# Patient Record
Sex: Female | Born: 1970
Health system: Southern US, Community
[De-identification: ages and names within clinical notes are randomized; demographics above are authoritative.]

## PROBLEM LIST (undated history)

## (undated) DIAGNOSIS — I10 Essential (primary) hypertension: Secondary | ICD-10-CM

## (undated) DIAGNOSIS — D649 Anemia, unspecified: Secondary | ICD-10-CM

## (undated) DIAGNOSIS — Z862 Personal history of diseases of the blood and blood-forming organs and certain disorders involving the immune mechanism: Secondary | ICD-10-CM

## (undated) HISTORY — DX: Essential (primary) hypertension: I10

## (undated) HISTORY — PX: HERNIA REPAIR: SHX51

## (undated) HISTORY — DX: Anemia, unspecified: D64.9

## (undated) HISTORY — DX: Personal history of diseases of the blood and blood-forming organs and certain disorders involving the immune mechanism: Z86.2

---

## 2005-09-18 ENCOUNTER — Inpatient Hospital Stay (HOSPITAL_COMMUNITY): Admission: AD | Admit: 2005-09-18 | Discharge: 2005-09-20 | Payer: Self-pay | Admitting: Obstetrics and Gynecology

## 2005-10-09 ENCOUNTER — Encounter: Admission: RE | Admit: 2005-10-09 | Discharge: 2005-10-09 | Payer: Self-pay | Admitting: Sports Medicine

## 2007-03-27 ENCOUNTER — Inpatient Hospital Stay (HOSPITAL_COMMUNITY): Admission: AD | Admit: 2007-03-27 | Discharge: 2007-03-29 | Payer: Self-pay | Admitting: Obstetrics and Gynecology

## 2009-12-26 ENCOUNTER — Encounter: Admission: RE | Admit: 2009-12-26 | Discharge: 2009-12-26 | Payer: Self-pay | Admitting: Orthopedic Surgery

## 2010-11-30 HISTORY — PX: HERNIA REPAIR: SHX51

## 2011-04-17 NOTE — H&P (Signed)
NAMESEPTEMBER, MORMILE NO.:  [id number]   MEDICAL RECORD NO.:  [id number]          PATIENT TYPE:  INP   LOCATION:  9164                          FACILITY:  [facility]   PHYSICIAN:  Lenoard Aden, M.D.DATE OF BIRTH:  [birth year]   DATE OF ADMISSION:  03/27/2007  DATE OF DISCHARGE:                              HISTORY & PHYSICAL   INDICATION FOR INDUCTION:  A 39+ weeks granmultip with favorable cervix.  She is a 40 year old white female G6, P5, EDD of March 27, 2008, at 39+  weeks, favorable cervix for induction.  She has a history of no known  drug allergies.   MEDICATIONS:  Prenatal vitamins.   Tonsillectomy at age 89.  She is a nonsmoker, nondrinker.  She denies  domestic or physical violence.   MEDICATIONS:  Zofran and prenatal vitamins.   She has a history of 5 spontaneous vaginal deliveries in 1999, 2001,  2002, 2003 and 2006.  She is a nonsmoker, nondrinker.  She denies  domestic and physical violence.  Pregnancy course to date uncomplicated.  GBS is negative.   PHYSICAL EXAM:  She is a well-developed, well-nourished white female in  no acute distress.  HEENT:  Normal.  LUNGS:  Clear.  HEART:  Regular rhythm.  ABDOMEN:  Soft, gravid, nontender.  Estimated fetal weight 7 pounds.  CERVIX:  Is 3-4 cm, 70%, vertex -1.  EXTREMITIES:  Showed no __________ .  NEUROLOGIC EXAM:  Nonfocal.   IMPRESSION:  Term intrauterine pregnancy at 39+ weeks with a history of  precipitous labor, for induction.   PLAN:  Proceed with induction.  Risks and benefits discussed.      Lenoard Aden, M.D.  Electronically Signed     RJT/MEDQ  D:  03/27/2007  T:  03/27/2007  Job:  [id number]

## 2011-04-17 NOTE — H&P (Signed)
NAMESHTERNA, LARAMEE NO.:  [id number]   MEDICAL RECORD NO.:  [id number]          PATIENT TYPE:  INP   LOCATION:  9160                          [facility]ILITY:  [facility]   PHYSICIAN:  Lenoard Aden, M.D.DATE OF BIRTH:  Apr 14, 1971   DATE OF ADMISSION:  09/18/2005  DATE OF DISCHARGE:                                HISTORY & PHYSICAL   CHIEF COMPLAINT:  History of precipitous labor.   She is a 40 year old white female, G5, P4, [facility] September 28, 2005, at 38+  weeks, for induction due to history of precipitous labor.  She has a history  of four uncomplicated vaginal deliveries.   History of tonsillectomy.   FAMILY HISTORY:  Alcoholism and myocardial infarction.   She is a nonsmoker, nondrinker.  She denies domestic or physical violence.   PRENATAL LABORATORY DATA:  Blood type A positive.  Rubella immune.  Hepatitis and HIV nonreactive.  GBS is negative.   Pregnancy course uncomplicated.   PHYSICAL EXAMINATION:  GENERAL:  A well-developed, well-nourished white  female in no acute distress.  HEENT:  Normal.  CHEST:  Lungs clear.  CARDIAC:  Regular rate and rhythm.  ABDOMEN:  Soft, gravid, nontender.  Estimated fetal weight 7 pounds.  PELVIC:  Cervix is 2-3 cm, 80%, vertex -1.  EXTREMITIES:  No cords.  NEUROLOGIC:  Nonfocal.   IMPRESSION:  Term intrauterine pregnancy, history of precipitous labor, for  induction.   PLAN:  Proceed with induction.  Risks and benefits discussed.  Proceed with  Pitocin, epidural as needed.      Lenoard Aden, M.D.  Electronically Signed     RJT/MEDQ  D:  09/18/2005  T:  09/18/2005  Job:  [id number]

## 2011-12-17 ENCOUNTER — Encounter: Payer: Self-pay | Admitting: Cardiology

## 2011-12-17 ENCOUNTER — Ambulatory Visit (INDEPENDENT_AMBULATORY_CARE_PROVIDER_SITE_OTHER): Payer: BC Managed Care – PPO | Admitting: Cardiology

## 2011-12-17 VITALS — BP 152/94 | HR 77 | Ht 67.0 in | Wt 131.0 lb

## 2011-12-17 DIAGNOSIS — Z8249 Family history of ischemic heart disease and other diseases of the circulatory system: Secondary | ICD-10-CM

## 2011-12-17 DIAGNOSIS — I10 Essential (primary) hypertension: Secondary | ICD-10-CM | POA: Insufficient documentation

## 2011-12-17 DIAGNOSIS — R002 Palpitations: Secondary | ICD-10-CM

## 2011-12-17 NOTE — Progress Notes (Signed)
HPI Mrs. Tiffany Willis is a delightful 41 year old married white female, daughter of a patient of mine, who comes today self referred for palpitations and family history of coronary disease.  She had the flu 2 weeks before Christmas. He still has a dry cough. She has intermittent palpitations that are not related to activity. She denies any chest pain, presyncope or syncope. She denies any fever, headache, or chills.   She denies orthopnea, PND or edema.  She also has episodes of tingling throughout her body. These are intermittent, diffuse, and sporadic.  She denies any symptoms of polyuria, polydipsia, heat or cold intolerance.There is no history of hyperthyroidism or thyroid disease.  Her father has coronary disease and hyperlipidemia. He has not had her lipids checked in years. She has no other risk factors.  History reviewed. No pertinent past medical history.  No current outpatient prescriptions on file.    No Known Allergies  Family History  Problem Relation Age of Onset  . Heart disease      History   Social History  . Marital Status: Married    Spouse Name: N/A    Number of Children: 6  . Years of Education: N/A   Occupational History  . Not on file.   Social History Main Topics  . Smoking status: Former Games developer  . Smokeless tobacco: Not on file   Comment: socially when younger  . Alcohol Use: Yes     rarely  . Drug Use: No  . Sexually Active: Not on file   Other Topics Concern  . Not on file   Social History Narrative  . No narrative on file    ROS ALL NEGATIVE EXCEPT THOSE NOTED IN HPI  PE  General Appearance: well developed, well nourished in no acute distress, HEENT: symmetrical face, PERRLA, good dentition  Neck: no JVD, thyromegaly, or adenopathy, trachea midline Chest: symmetric without deformity Cardiac: PMI non-displaced, RRR, normal S1, S2, no gallop or murmur Lung: clear to ausculation and percussion Vascular: all pulses full without bruits    Abdominal: nondistended, nontender, good bowel sounds, no HSM, no bruits Extremities: no cyanosis, clubbing or edema, no sign of DVT, no varicosities  Skin: normal color, no rashes Neuro: alert and oriented x 3, non-focal Pysch: normal affect  EKG Normal sinus rhythm, possible left atrial enlargement but unlikely. No sign of LVH.BMET No results found for this basename: na, k, cl, co2, glucose, bun, creatinine, calcium, gfrnonaa, gfraa    Lipid Panel  No results found for this basename: chol, trig, hdl, cholhdl, vldl, ldlcalc    CBC No results found for this basename: wbc, rbc, hgb, hct, plt, mcv, mch, mchc, rdw, neutrabs, lymphsabs, monoabs, eosabs, basosabs     He

## 2011-12-17 NOTE — Assessment & Plan Note (Signed)
I suspect these are benign and may be related to stress not to mention recovering from the flu. I'll make sure that she has no post viral cardiomyopathy so we'll obtain a echocardiogram. Also want to see if she has any degree of left ventricular hypertrophy with newly diagnosed hypertension.

## 2011-12-17 NOTE — Assessment & Plan Note (Signed)
This is potentially a new diagnosis. Blood pressure goals given and her husband will check pressures on regular basis. If remain elevated, and depending on her echocardiogram findings, will make recommendation.

## 2011-12-17 NOTE — Patient Instructions (Signed)
Your physician has requested that you have an echocardiogram. Echocardiography is a painless test that uses sound waves to create images of your heart. It provides your doctor with information about the size and shape of your heart and how well your heart's chambers and valves are working. This procedure takes approximately one hour. There are no restrictions for this procedure.  Your physician recommends that you return for lab work in:  Same day bas echo for fasting lab work  Your physician has requested that you regularly monitor and record your blood pressure readings at home. Please use the same machine at the same time of day to check your readings and record them to bring to your follow-up visit.  Goal blood pressure of 130/80 or less

## 2011-12-17 NOTE — Assessment & Plan Note (Signed)
We'll check fasting lipids, baseline chemistries, liver function and TSH.

## 2011-12-18 ENCOUNTER — Other Ambulatory Visit: Payer: BC Managed Care – PPO | Admitting: *Deleted

## 2011-12-18 ENCOUNTER — Other Ambulatory Visit (HOSPITAL_COMMUNITY): Payer: BC Managed Care – PPO | Admitting: Radiology

## 2011-12-24 ENCOUNTER — Other Ambulatory Visit (INDEPENDENT_AMBULATORY_CARE_PROVIDER_SITE_OTHER): Payer: BC Managed Care – PPO | Admitting: *Deleted

## 2011-12-24 ENCOUNTER — Ambulatory Visit (HOSPITAL_COMMUNITY): Payer: BC Managed Care – PPO | Attending: Cardiology | Admitting: Radiology

## 2011-12-24 DIAGNOSIS — R002 Palpitations: Secondary | ICD-10-CM

## 2011-12-24 DIAGNOSIS — I10 Essential (primary) hypertension: Secondary | ICD-10-CM | POA: Insufficient documentation

## 2011-12-24 DIAGNOSIS — Z8249 Family history of ischemic heart disease and other diseases of the circulatory system: Secondary | ICD-10-CM | POA: Insufficient documentation

## 2011-12-24 LAB — BASIC METABOLIC PANEL
BUN: 11 mg/dL (ref 6–23)
CO2: 27 mEq/L (ref 19–32)
Calcium: 9.1 mg/dL (ref 8.4–10.5)
Chloride: 107 mEq/L (ref 96–112)
Creatinine, Ser: 0.7 mg/dL (ref 0.4–1.2)
GFR: 98.74 mL/min (ref >60.00)
Glucose, Bld: 81 mg/dL (ref 70–99)
Potassium: 3.9 mEq/L (ref 3.5–5.1)
Sodium: 140 mEq/L (ref 135–145)

## 2011-12-24 LAB — HEPATIC FUNCTION PANEL
Albumin: 4.1 g/dL (ref 3.5–5.2)
Alkaline Phosphatase: 41 U/L (ref 39–117)
Bilirubin, Direct: 0.1 mg/dL (ref 0.0–0.3)
Total Protein: 6.9 g/dL (ref 6.0–8.3)

## 2011-12-24 LAB — LIPID PANEL
Cholesterol: 173 mg/dL (ref 0–200)
HDL: 58.2 mg/dL (ref >39.00)
LDL Cholesterol: 106 mg/dL — ABNORMAL HIGH (ref 0–99)
Triglycerides: 45 mg/dL (ref 0.0–149.0)
VLDL: 9 mg/dL (ref 0.0–40.0)

## 2014-07-24 ENCOUNTER — Telehealth: Payer: Self-pay | Admitting: Obstetrics & Gynecology

## 2014-07-24 NOTE — Telephone Encounter (Signed)
lmtcb re: new patient doctor referral with Dr. Hyacinth Meeker 07/30/14.

## 2014-07-30 ENCOUNTER — Encounter: Payer: Self-pay | Admitting: Obstetrics & Gynecology

## 2014-07-30 ENCOUNTER — Ambulatory Visit (INDEPENDENT_AMBULATORY_CARE_PROVIDER_SITE_OTHER): Payer: BC Managed Care – PPO | Admitting: Obstetrics & Gynecology

## 2014-07-30 VITALS — BP 138/90 | HR 68 | Ht 66.5 in | Wt 130.0 lb

## 2014-07-30 DIAGNOSIS — N393 Stress incontinence (female) (male): Secondary | ICD-10-CM

## 2014-07-30 DIAGNOSIS — Z01419 Encounter for gynecological examination (general) (routine) without abnormal findings: Secondary | ICD-10-CM

## 2014-07-30 DIAGNOSIS — N920 Excessive and frequent menstruation with regular cycle: Secondary | ICD-10-CM

## 2014-07-30 DIAGNOSIS — D509 Iron deficiency anemia, unspecified: Secondary | ICD-10-CM

## 2014-07-30 DIAGNOSIS — Z23 Encounter for immunization: Secondary | ICD-10-CM

## 2014-07-30 DIAGNOSIS — Z124 Encounter for screening for malignant neoplasm of cervix: Secondary | ICD-10-CM

## 2014-07-30 DIAGNOSIS — Z0000 Encounter for general adult medical examination without abnormal findings: Secondary | ICD-10-CM

## 2014-07-30 LAB — POCT URINALYSIS DIPSTICK
Bilirubin, UA: NEGATIVE
Blood, UA: NEGATIVE
Glucose, UA: NEGATIVE
Ketones, UA: NEGATIVE
Leukocytes, UA: NEGATIVE
Nitrite, UA: NEGATIVE
Protein, UA: NEGATIVE
Urobilinogen, UA: NEGATIVE
pH, UA: 7

## 2014-07-30 NOTE — Progress Notes (Signed)
43 y.o. G6P6 MWF here for AEX.  Has not has a gynecology exam in several years.  PCP:  Martha Clan.  Screening labs were done about a year ago.  Anemia was noted then.  Repeat about 10 days ago showing continued anemia.  Anemia most likely due to menorrhagia.  Cycles are regular lasting 5-6 days with one heavy day where she changes tampons about every hour.  On iron.  Hasn't really considered other options for bleeding.  Is interested in discussing these.  Does feel tired a lot.  Has six children.  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  Having as much energy as possible is needed for her life.  Also, having some urinary incontinence.  Is primarily with coughing and sneezing or laughing.  Would consider treatment options.  Patient's last menstrual period was 07/23/2014.          Sexually active: Yes.    The current method of family planning is vasectomy.    Exercising: No.  The patient does not participate in regular exercise at present. Smoker:  no  Health Maintenance: Pap:  About 5 years History of abnormal Pap:  no MMG:  2012 normal per pt Colonoscopy:  never BMD:   never TDaP:  unsure Screening Labs: last week (pcp), Hb today: PCP, Urine today: negative   reports that she has quit smoking. She does not have any smokeless tobacco history on file. She reports that she drinks alcohol. She reports that she does not use illicit drugs.  History reviewed. No pertinent past medical history.  Past Surgical History  Procedure Laterality Date  . Hernia repair      Current Outpatient Prescriptions  Medication Sig Dispense Refill  . loratadine (CLARITIN) 10 MG tablet Take 10 mg by mouth daily.      . Prenatal Vit-Fe Psac Cmplx-FA (POLY IRON PN PO) Take by mouth daily.       No current facility-administered medications for this visit.    Family History  Problem Relation Age of Onset  . Heart disease      ROS:  Pertinent items are noted in HPI.  Otherwise, a comprehensive ROS was  negative.  Exam:   BP 138/90  Pulse 68  Ht 5' 6.5" (1.689 m)  Wt 130 lb (58.968 kg)  BMI 20.67 kg/m2  LMP 07/23/2014  Height: 5' 6.5" (168.9 cm)  Ht Readings from Last 3 Encounters:  07/30/14 5' 6.5" (1.689 m)  12/17/11  (1.702 m)    General appearance: alert, cooperative and appears stated age Head: Normocephalic, without obvious abnormality, atraumatic Neck: no adenopathy, supple, symmetrical, trachea midline and thyroid normal to inspection and palpation Lungs: clear to auscultation bilaterally Breasts: normal appearance, no masses or tenderness Heart: regular rate and rhythm Abdomen: soft, non-tender; bowel sounds normal; no masses,  no organomegaly Extremities: extremities normal, atraumatic, no cyanosis or edema Skin: Skin color, texture, turgor normal. No rashes or lesions Lymph nodes: Cervical, supraclavicular, and axillary nodes normal. No abnormal inguinal nodes palpated Neurologic: Grossly normal   Pelvic: External genitalia:  no lesions              Urethra:  normal appearing urethra with no masses, tenderness or lesions              Bartholins and Skenes: normal                 Vagina: normal appearing vagina with normal color and discharge, no lesions  Cervix: no lesions              Pap taken: Yes.   Bimanual Exam:  Uterus:  normal size, contour, position, consistency, mobility, non-tender              Adnexa: normal adnexa and no mass, fullness, tenderness               Rectovaginal: Confirms               Anus:  normal sphincter tone, no lesions  Due to bleeding, recommended proceeding with endometrial biopsy today.  Discussed with patient.  Verbal and written consent obtained.   Procedure:  Speculum placed.  Cervix visualized and cleansed with betadine prep.  A single toothed tenaculum was not applied to the anterior lip of the cervix.  Endometrial pipelle was advanced through the cervix into the endometrial cavity without difficulty.   Pipelle passed to 8cm.  Suction applied and pipelle removed with good tissue sample obtained.  Tenculum removed.  No bleeding noted.  Patient tolerated procedure well.  Speculum removed.  A:  Well Woman with normal exam Menorrhagia Anemia H/o NSVD x 6 with no significant prolpase  P:   Mammogram recommended.  Pt states she will schedule.  Information provided. pap smear with HR HPV done today Endometrial biopsy results pending Tdap given today. Will get records from Dr. Alver Fisher office regarding last hemoglobin so I have comparisons for future. Information regarinding IUD use and endometrial ablation given.  Also discussed OCPs and other progesterone methods.  Pt most interested in Mirena IUD or endometrial ablation. Consult with Dr. Edward Jolly regarding incontinence scheduled.  Urodynamics discussed as well as treatment with surgery or PT.   Return annually or prn  An After Visit Summary was printed and given to the patient.

## 2014-08-01 ENCOUNTER — Telehealth: Payer: Self-pay | Admitting: *Deleted

## 2014-08-01 DIAGNOSIS — N920 Excessive and frequent menstruation with regular cycle: Secondary | ICD-10-CM | POA: Insufficient documentation

## 2014-08-01 DIAGNOSIS — D509 Iron deficiency anemia, unspecified: Secondary | ICD-10-CM | POA: Insufficient documentation

## 2014-08-01 DIAGNOSIS — N393 Stress incontinence (female) (male): Secondary | ICD-10-CM | POA: Insufficient documentation

## 2014-08-01 LAB — IPS PAP TEST WITH HPV

## 2014-08-01 NOTE — Telephone Encounter (Signed)
Message copied by Alisa Graff on Wed Aug 01, 2014  3:52 PM ------      Message from: Jerene Bears      Created: Wed Aug 01, 2014  3:28 PM      Regarding: pathology       I signed off her pathology without a note when I signed off her pap.  Can you call her and let her know her endometrial biopsy was negative, her Pap was negative, and her HR HPV was negative.  She is deciding between and IUD and an ablation.  If she wants the IUD, she can call with onset of cycle to schedule.  If wants ablation, needs [facility] scheduled.  She is also seeing Dr. Edward Jolly about incontinence.  Thanks.            MSM ------

## 2014-08-01 NOTE — Telephone Encounter (Signed)
Call to patient, notified of pap, HPV and endometrial biopsy results all negative. Patient states she and husband have started talking about options but she is not quite ready to make a final decision. Advised with negative biopsy, she is not really pushed to make decision, can decide on her own time line and call us if/when ready to proceed. She is scheduled for consult with Dr Edward Jolly.  Encounter closed.

## 2014-08-10 ENCOUNTER — Ambulatory Visit: Payer: Self-pay | Admitting: Obstetrics and Gynecology

## 2014-08-15 ENCOUNTER — Telehealth: Payer: Self-pay | Admitting: Obstetrics & Gynecology

## 2014-08-15 NOTE — Telephone Encounter (Signed)
Pt cancelled consult appt with Dr Hyacinth Meeker and will call back to reschedule.

## 2014-08-15 NOTE — Telephone Encounter (Signed)
This is consult with Dr. Edward Jolly to discuss urinary concerns.  Routing to Dr. Hyacinth Meeker as Lorain Childes,  Will close encounter.

## 2014-08-17 ENCOUNTER — Institutional Professional Consult (permissible substitution): Payer: BC Managed Care – PPO | Admitting: Obstetrics and Gynecology

## 2014-10-01 ENCOUNTER — Encounter: Payer: Self-pay | Admitting: Obstetrics & Gynecology

## 2014-11-30 HISTORY — PX: LUMBAR DISC SURGERY: SHX700

## 2015-01-02 ENCOUNTER — Telehealth: Payer: Self-pay | Admitting: Obstetrics & Gynecology

## 2015-01-02 NOTE — Telephone Encounter (Signed)
Left message regarding upcoming appointment has been canceled and needs to be rescheduled. °

## 2015-05-15 ENCOUNTER — Other Ambulatory Visit: Payer: Self-pay | Admitting: Orthopedic Surgery

## 2015-05-15 DIAGNOSIS — M5410 Radiculopathy, site unspecified: Secondary | ICD-10-CM

## 2015-05-16 ENCOUNTER — Ambulatory Visit
Admission: RE | Admit: 2015-05-16 | Discharge: 2015-05-16 | Disposition: A | Discharge status: Home / Self Care | Payer: BLUE CROSS/BLUE SHIELD | Source: Ambulatory Visit | Attending: Orthopedic Surgery | Admitting: Orthopedic Surgery

## 2015-05-16 DIAGNOSIS — M5410 Radiculopathy, site unspecified: Secondary | ICD-10-CM

## 2015-08-29 ENCOUNTER — Ambulatory Visit: Payer: BC Managed Care – PPO | Admitting: Obstetrics & Gynecology

## 2016-07-09 DIAGNOSIS — R0982 Postnasal drip: Secondary | ICD-10-CM | POA: Diagnosis not present

## 2016-07-09 DIAGNOSIS — F458 Other somatoform disorders: Secondary | ICD-10-CM | POA: Diagnosis not present

## 2016-08-05 DIAGNOSIS — D225 Melanocytic nevi of trunk: Secondary | ICD-10-CM | POA: Diagnosis not present

## 2016-08-05 DIAGNOSIS — D2272 Melanocytic nevi of left lower limb, including hip: Secondary | ICD-10-CM | POA: Diagnosis not present

## 2016-08-05 DIAGNOSIS — Z86018 Personal history of other benign neoplasm: Secondary | ICD-10-CM | POA: Diagnosis not present

## 2016-09-04 DIAGNOSIS — R0982 Postnasal drip: Secondary | ICD-10-CM | POA: Diagnosis not present

## 2016-09-04 DIAGNOSIS — R05 Cough: Secondary | ICD-10-CM | POA: Diagnosis not present

## 2016-09-04 DIAGNOSIS — Z6822 Body mass index (BMI) 22.0-22.9, adult: Secondary | ICD-10-CM | POA: Diagnosis not present

## 2016-09-30 DIAGNOSIS — N3946 Mixed incontinence: Secondary | ICD-10-CM | POA: Diagnosis not present

## 2016-09-30 DIAGNOSIS — R351 Nocturia: Secondary | ICD-10-CM | POA: Diagnosis not present

## 2016-09-30 DIAGNOSIS — R350 Frequency of micturition: Secondary | ICD-10-CM | POA: Diagnosis not present

## 2016-09-30 DIAGNOSIS — N816 Rectocele: Secondary | ICD-10-CM | POA: Diagnosis not present

## 2016-12-02 DIAGNOSIS — F4321 Adjustment disorder with depressed mood: Secondary | ICD-10-CM | POA: Diagnosis not present

## 2016-12-07 DIAGNOSIS — F4321 Adjustment disorder with depressed mood: Secondary | ICD-10-CM | POA: Diagnosis not present

## 2016-12-08 DIAGNOSIS — F4321 Adjustment disorder with depressed mood: Secondary | ICD-10-CM | POA: Diagnosis not present

## 2016-12-24 DIAGNOSIS — H5213 Myopia, bilateral: Secondary | ICD-10-CM | POA: Diagnosis not present

## 2017-01-19 DIAGNOSIS — F4321 Adjustment disorder with depressed mood: Secondary | ICD-10-CM | POA: Diagnosis not present

## 2017-01-26 DIAGNOSIS — F4321 Adjustment disorder with depressed mood: Secondary | ICD-10-CM | POA: Diagnosis not present

## 2017-02-11 DIAGNOSIS — F4321 Adjustment disorder with depressed mood: Secondary | ICD-10-CM | POA: Diagnosis not present

## 2017-02-18 DIAGNOSIS — F4321 Adjustment disorder with depressed mood: Secondary | ICD-10-CM | POA: Diagnosis not present

## 2017-04-08 DIAGNOSIS — F4321 Adjustment disorder with depressed mood: Secondary | ICD-10-CM | POA: Diagnosis not present

## 2017-04-29 DIAGNOSIS — F4321 Adjustment disorder with depressed mood: Secondary | ICD-10-CM | POA: Diagnosis not present

## 2017-06-10 ENCOUNTER — Other Ambulatory Visit: Payer: Self-pay | Admitting: Neurological Surgery

## 2017-06-10 DIAGNOSIS — M5127 Other intervertebral disc displacement, lumbosacral region: Secondary | ICD-10-CM

## 2017-06-11 ENCOUNTER — Ambulatory Visit (INDEPENDENT_AMBULATORY_CARE_PROVIDER_SITE_OTHER): Payer: BLUE CROSS/BLUE SHIELD | Admitting: Internal Medicine

## 2017-06-11 DIAGNOSIS — Z23 Encounter for immunization: Secondary | ICD-10-CM | POA: Diagnosis not present

## 2017-06-11 DIAGNOSIS — Z9189 Other specified personal risk factors, not elsewhere classified: Secondary | ICD-10-CM | POA: Diagnosis not present

## 2017-06-11 DIAGNOSIS — Z7184 Encounter for health counseling related to travel: Secondary | ICD-10-CM | POA: Insufficient documentation

## 2017-06-11 DIAGNOSIS — Z789 Other specified health status: Secondary | ICD-10-CM | POA: Diagnosis not present

## 2017-06-11 DIAGNOSIS — Z7189 Other specified counseling: Secondary | ICD-10-CM | POA: Diagnosis not present

## 2017-06-11 MED ORDER — TYPHOID VACCINE PO CPDR: 1.0000 | DELAYED_RELEASE_CAPSULE | ORAL | 0 refills | Status: DC

## 2017-06-11 MED ORDER — CIPROFLOXACIN HCL 500 MG PO TABS: 500.0000 mg | ORAL_TABLET | Freq: Two times a day (BID) | ORAL | 0 refills | Status: DC

## 2017-06-11 MED ORDER — CHLOROQUINE PHOSPHATE 500 MG PO TABS: 500.0000 mg | ORAL_TABLET | ORAL | 0 refills | Status: DC

## 2017-06-11 NOTE — Progress Notes (Signed)
Subjective:   Tiffany Willis is a 46 y.o. female who presents to the Infectious Disease clinic for travel consultation. 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 none     Plan:    Issues discussed: future shots, malaria, MVA safety, rabies, safe food/water, traveler's diarrhea, website/handouts for more information, what to do if ill upon return and what to do if ill while there. Immunizations recommended: Hepatitis A series and Typhoid (oral). Malaria prophylaxis: chloroquine, weekly dose starting 1 week before entering endemic area, ending 4 weeks after leaving area Traveler's diarrhea prophylaxis: ciprofloxacin. Total duration of visit: 1 Hour. Total time spent on education, counseling, coordination of care: 30 Minutes.

## 2017-06-11 NOTE — Patient Instructions (Signed)
[facility]                [address]                   [place], [place] [id number]                      Phone: [phone]                        Fax: [phone]   Planned departure date: July 2018          Planned return date: 7 days Countries of travel: [place]   Guidelines for the Prevention & Treatment of Traveler's Diarrhea  Prevention: "Boil it, Peel it, Coleraineook it, or Forget it"   the fewer chances -> lower risk: try to stick to food & water precautions as much as possible"   If it's "piping hot"; it is probably okay, if not, it may not be   Treatment   1) You should always take care to drink lots of fluids in order to avoid dehydration   2) You should bring medications with you in case you come down with a case of diarrhea   3) OTC = bring pepto-bismol - can take with initial abdominal symptoms;                    Imodium - can help slow down your intestinal tract, can help relief cramps                    and diarrhea, can take if no bloody diarrhea  Use ciprofloxacin if needed for traveler's diarrhea  Guidelines for the Prevention of Malaria  Avoidance:  -fewer mosquito bites = lower risk. Mosquitos can bite at night as well as daytime  -cover up (long sleeve clothing), mosquito nets, screens  -Insect repellent for your skin ( DEET containing lotion > 20%): for clothes ( permethrin spray)   2 weeks prior to travel, start chloroquine, weekly dose starting 1 week before entering endemic area, ending 4 weeks after leaving area for malaria prevention.   Immunizations received today: Hepatitis A series and Typhoid (parenteral)  Future immunizations, if indicated Hepatitis A series in 6-12 months, after January 2019   Prior to travel:  1) Be sure to pick up appropriate prescriptions, including medicine you take daily. Do not expect to be able to fill your prescriptions abroad.  2) Strongly  consider obtaining traveler's insurance, including emergency evacuation insurance. Most plans in the [place] do not cover participants abroad. (see below for resources)  3) Register at the appropriate [place] embassy or consulate with travel dates so they are aware of your presence in-country and for helpful advice during travel using the [employer] Traveler Enrollment Program (STEP, GuyGalaxy.sihttps://step.state.gov/step).  4) Leave contact information with a relative or friend.  5) Keep a Corporate treasurerphotocopy passport, credit cards in case they become lost or stolen  6) Inform your credit card company that you will be travelling abroad   During travel:  1) If you become ill and need medical advice, the [place] WellPointembassy website of the country you are traveling in general provides a list of English speaking doctors.  We are also available on MyChart for remote consultation if you register prior to travel. 2) Avoid motorcycles or scooters when at all possible. Traffic laws in many  countries are lax and accidents occur frequently.  3) Do not take any unnecessary risks that you wouldn't do at home.   Resources:  -Country specific information: BlindResource.ca or GreenNylon.com.cy  -Press photographer (DEET, mosquito nets): [employer], [employer] store, [employer], [place] insurance options: gatewayplans.com; http://clayton-rivera.info/; travelguard.com or Good Pilgrim's Pride, gninsurance.com or info@gninsurance .com, 269-689-4941.   Post Travel:  If you return from your trip ill, call your primary care doctor or our travel clinic @ [phone].   Enjoy your trip and know that with proper pre-travel preparation, most people have an enjoyable and uninterrupted trip!

## 2017-06-18 ENCOUNTER — Inpatient Hospital Stay
Admission: RE | Admit: 2017-06-18 | Discharge: 2017-06-18 | Disposition: A | Discharge status: Home / Self Care | Payer: BLUE CROSS/BLUE SHIELD | Source: Ambulatory Visit | Attending: Neurological Surgery | Admitting: Neurological Surgery

## 2017-09-09 DIAGNOSIS — Z0000 Encounter for general adult medical examination without abnormal findings: Secondary | ICD-10-CM | POA: Diagnosis not present

## 2017-09-10 DIAGNOSIS — Z23 Encounter for immunization: Secondary | ICD-10-CM | POA: Diagnosis not present

## 2017-09-10 DIAGNOSIS — N946 Dysmenorrhea, unspecified: Secondary | ICD-10-CM | POA: Diagnosis not present

## 2017-09-10 DIAGNOSIS — E611 Iron deficiency: Secondary | ICD-10-CM | POA: Diagnosis not present

## 2017-09-10 DIAGNOSIS — Z0000 Encounter for general adult medical examination without abnormal findings: Secondary | ICD-10-CM | POA: Diagnosis not present

## 2017-09-10 DIAGNOSIS — R5383 Other fatigue: Secondary | ICD-10-CM | POA: Diagnosis not present

## 2017-09-10 DIAGNOSIS — Z1389 Encounter for screening for other disorder: Secondary | ICD-10-CM | POA: Diagnosis not present

## 2017-09-14 DIAGNOSIS — R5383 Other fatigue: Secondary | ICD-10-CM | POA: Diagnosis not present

## 2017-09-14 DIAGNOSIS — E611 Iron deficiency: Secondary | ICD-10-CM | POA: Diagnosis not present

## 2017-10-27 DIAGNOSIS — F4321 Adjustment disorder with depressed mood: Secondary | ICD-10-CM | POA: Diagnosis not present

## 2018-01-13 DIAGNOSIS — H5213 Myopia, bilateral: Secondary | ICD-10-CM | POA: Diagnosis not present

## 2018-08-17 DIAGNOSIS — D2272 Melanocytic nevi of left lower limb, including hip: Secondary | ICD-10-CM | POA: Diagnosis not present

## 2018-08-17 DIAGNOSIS — Z86018 Personal history of other benign neoplasm: Secondary | ICD-10-CM | POA: Diagnosis not present

## 2018-08-17 DIAGNOSIS — D225 Melanocytic nevi of trunk: Secondary | ICD-10-CM | POA: Diagnosis not present

## 2018-09-12 DIAGNOSIS — J029 Acute pharyngitis, unspecified: Secondary | ICD-10-CM | POA: Diagnosis not present

## 2018-09-12 DIAGNOSIS — R030 Elevated blood-pressure reading, without diagnosis of hypertension: Secondary | ICD-10-CM | POA: Diagnosis not present

## 2018-09-12 DIAGNOSIS — R599 Enlarged lymph nodes, unspecified: Secondary | ICD-10-CM | POA: Diagnosis not present

## 2018-09-12 DIAGNOSIS — J069 Acute upper respiratory infection, unspecified: Secondary | ICD-10-CM | POA: Diagnosis not present

## 2018-09-12 DIAGNOSIS — N6313 Unspecified lump in the right breast, lower outer quadrant: Secondary | ICD-10-CM | POA: Diagnosis not present

## 2018-09-13 ENCOUNTER — Other Ambulatory Visit: Payer: Self-pay | Admitting: Family Medicine

## 2018-09-13 DIAGNOSIS — N6313 Unspecified lump in the right breast, lower outer quadrant: Secondary | ICD-10-CM

## 2018-09-13 DIAGNOSIS — R5383 Other fatigue: Secondary | ICD-10-CM | POA: Diagnosis not present

## 2018-09-13 DIAGNOSIS — Z0000 Encounter for general adult medical examination without abnormal findings: Secondary | ICD-10-CM | POA: Diagnosis not present

## 2018-09-20 ENCOUNTER — Ambulatory Visit
Admission: RE | Admit: 2018-09-20 | Discharge: 2018-09-20 | Disposition: A | Discharge status: Home / Self Care | Payer: BLUE CROSS/BLUE SHIELD | Source: Ambulatory Visit | Attending: Family Medicine | Admitting: Family Medicine

## 2018-09-20 ENCOUNTER — Other Ambulatory Visit: Payer: Self-pay | Admitting: Family Medicine

## 2018-09-20 DIAGNOSIS — N6311 Unspecified lump in the right breast, upper outer quadrant: Secondary | ICD-10-CM | POA: Diagnosis not present

## 2018-09-20 DIAGNOSIS — N6313 Unspecified lump in the right breast, lower outer quadrant: Secondary | ICD-10-CM

## 2018-09-20 DIAGNOSIS — Z1389 Encounter for screening for other disorder: Secondary | ICD-10-CM | POA: Diagnosis not present

## 2018-09-20 DIAGNOSIS — R7989 Other specified abnormal findings of blood chemistry: Secondary | ICD-10-CM | POA: Diagnosis not present

## 2018-09-20 DIAGNOSIS — R030 Elevated blood-pressure reading, without diagnosis of hypertension: Secondary | ICD-10-CM | POA: Diagnosis not present

## 2018-09-20 DIAGNOSIS — Z23 Encounter for immunization: Secondary | ICD-10-CM | POA: Diagnosis not present

## 2018-09-20 DIAGNOSIS — Z0000 Encounter for general adult medical examination without abnormal findings: Secondary | ICD-10-CM | POA: Diagnosis not present

## 2018-09-20 DIAGNOSIS — R922 Inconclusive mammogram: Secondary | ICD-10-CM | POA: Diagnosis not present

## 2018-09-20 DIAGNOSIS — R5383 Other fatigue: Secondary | ICD-10-CM | POA: Diagnosis not present

## 2018-09-20 DIAGNOSIS — E611 Iron deficiency: Secondary | ICD-10-CM | POA: Diagnosis not present

## 2018-09-20 DIAGNOSIS — J069 Acute upper respiratory infection, unspecified: Secondary | ICD-10-CM | POA: Diagnosis not present

## 2018-10-13 ENCOUNTER — Encounter: Payer: Self-pay | Admitting: Obstetrics & Gynecology

## 2018-11-29 ENCOUNTER — Ambulatory Visit
Admission: RE | Admit: 2018-11-29 | Discharge: 2018-11-29 | Disposition: A | Discharge status: Home / Self Care | Payer: BLUE CROSS/BLUE SHIELD | Source: Ambulatory Visit | Attending: Family Medicine | Admitting: Family Medicine

## 2018-11-29 DIAGNOSIS — N6489 Other specified disorders of breast: Secondary | ICD-10-CM | POA: Diagnosis not present

## 2018-11-29 DIAGNOSIS — N6313 Unspecified lump in the right breast, lower outer quadrant: Secondary | ICD-10-CM

## 2019-01-26 DIAGNOSIS — H5213 Myopia, bilateral: Secondary | ICD-10-CM | POA: Diagnosis not present

## 2019-02-02 DIAGNOSIS — F4322 Adjustment disorder with anxiety: Secondary | ICD-10-CM | POA: Diagnosis not present

## 2019-05-18 DIAGNOSIS — F4322 Adjustment disorder with anxiety: Secondary | ICD-10-CM | POA: Diagnosis not present

## 2019-09-19 DIAGNOSIS — Z0000 Encounter for general adult medical examination without abnormal findings: Secondary | ICD-10-CM | POA: Diagnosis not present

## 2019-09-20 DIAGNOSIS — E611 Iron deficiency: Secondary | ICD-10-CM | POA: Diagnosis not present

## 2019-09-22 ENCOUNTER — Other Ambulatory Visit: Payer: Self-pay | Admitting: Internal Medicine

## 2019-09-22 DIAGNOSIS — Z1231 Encounter for screening mammogram for malignant neoplasm of breast: Secondary | ICD-10-CM

## 2019-09-25 ENCOUNTER — Other Ambulatory Visit: Payer: Self-pay

## 2019-09-25 DIAGNOSIS — Z20822 Contact with and (suspected) exposure to covid-19: Secondary | ICD-10-CM

## 2019-09-26 DIAGNOSIS — Z23 Encounter for immunization: Secondary | ICD-10-CM | POA: Diagnosis not present

## 2019-09-26 DIAGNOSIS — Z1331 Encounter for screening for depression: Secondary | ICD-10-CM | POA: Diagnosis not present

## 2019-09-26 DIAGNOSIS — N946 Dysmenorrhea, unspecified: Secondary | ICD-10-CM | POA: Diagnosis not present

## 2019-09-26 DIAGNOSIS — E611 Iron deficiency: Secondary | ICD-10-CM | POA: Diagnosis not present

## 2019-09-26 DIAGNOSIS — Z0000 Encounter for general adult medical examination without abnormal findings: Secondary | ICD-10-CM | POA: Diagnosis not present

## 2019-09-26 LAB — NOVEL CORONAVIRUS, NAA: SARS-CoV-2, NAA: NOT DETECTED

## 2019-10-04 DIAGNOSIS — F4322 Adjustment disorder with anxiety: Secondary | ICD-10-CM | POA: Diagnosis not present

## 2019-11-14 ENCOUNTER — Other Ambulatory Visit: Payer: Self-pay

## 2019-11-14 ENCOUNTER — Ambulatory Visit
Admission: RE | Admit: 2019-11-14 | Discharge: 2019-11-14 | Disposition: A | Discharge status: Home / Self Care | Payer: BC Managed Care – PPO | Source: Ambulatory Visit | Attending: Internal Medicine | Admitting: Internal Medicine

## 2019-11-14 DIAGNOSIS — Z1231 Encounter for screening mammogram for malignant neoplasm of breast: Secondary | ICD-10-CM | POA: Diagnosis not present

## 2020-01-02 ENCOUNTER — Other Ambulatory Visit: Payer: Self-pay

## 2020-01-04 ENCOUNTER — Other Ambulatory Visit (HOSPITAL_COMMUNITY)
Admission: RE | Admit: 2020-01-04 | Discharge: 2020-01-04 | Disposition: A | Discharge status: Home / Self Care | Payer: BC Managed Care – PPO | Source: Ambulatory Visit | Attending: Obstetrics & Gynecology | Admitting: Obstetrics & Gynecology

## 2020-01-04 ENCOUNTER — Ambulatory Visit (INDEPENDENT_AMBULATORY_CARE_PROVIDER_SITE_OTHER): Payer: BC Managed Care – PPO | Admitting: Obstetrics & Gynecology

## 2020-01-04 ENCOUNTER — Other Ambulatory Visit: Payer: Self-pay

## 2020-01-04 ENCOUNTER — Encounter: Payer: Self-pay | Admitting: Obstetrics & Gynecology

## 2020-01-04 ENCOUNTER — Encounter: Payer: BC Managed Care – PPO | Admitting: Obstetrics & Gynecology

## 2020-01-04 VITALS — BP 110/62 | HR 76 | Temp 97.3°F | Resp 10 | Ht 66.25 in | Wt 142.0 lb

## 2020-01-04 DIAGNOSIS — Z01419 Encounter for gynecological examination (general) (routine) without abnormal findings: Secondary | ICD-10-CM

## 2020-01-04 DIAGNOSIS — R87610 Atypical squamous cells of undetermined significance on cytologic smear of cervix (ASC-US): Secondary | ICD-10-CM | POA: Diagnosis not present

## 2020-01-04 DIAGNOSIS — Z124 Encounter for screening for malignant neoplasm of cervix: Secondary | ICD-10-CM | POA: Diagnosis not present

## 2020-01-04 MED ORDER — NORETHINDRONE 0.35 MG PO TABS: 1.0000 | ORAL_TABLET | Freq: Every day | ORAL | 0 refills | Status: DC

## 2020-01-04 NOTE — Progress Notes (Signed)
49 y.o. [id number] Married White or Caucasian female here for new patient exam.  Pt was last seen in 2015.  Cycles are regular.  Flow is heavy for two days and then tapers off and lasts about 6 days.    PCP:  Dr. Clelia Croft.  Last appt was in person.  Had blood work done.  Has some mild anemia.    Patient's last menstrual period was 12/18/2019 (within days).          Sexually active: Yes.    The current method of family planning is vasectomy.    Exercising: No.  Smoker:  no  Health Maintenance: Pap:  07/30/14 Neg:Neg HR HPV History of abnormal Pap:  no MMG:  11/14/19 BIRADS 1 negative/density c Colonoscopy:  n/a BMD:   n/a TDaP:  07/30/14 Pneumonia vaccine(s):  n/a Shingrix:   no Hep C testing: n/a Screening Labs: done in October   reports that she has quit smoking. She has never used smokeless tobacco. She reports current alcohol use of about 7.0 - 14.0 standard drinks of alcohol per week. She reports that she does not use drugs.  Past Medical History:  Diagnosis Date  . Anemia     Past Surgical History:  Procedure Laterality Date  . HERNIA REPAIR    . HERNIA REPAIR  2012  . LUMBAR DISC SURGERY      Current Outpatient Medications  Medication Sig Dispense Refill  . Prenatal Vit-Fe Psac Cmplx-FA (POLY IRON PN PO) Take by mouth daily.    Marland Kitchen loratadine (CLARITIN) 10 MG tablet Take 10 mg by mouth daily.     No current facility-administered medications for this visit.    Family History  Problem Relation Age of Onset  . Heart disease Father   . Parkinson's disease Father   . Cancer Maternal Grandfather        colon  . Heart disease Paternal Grandfather   . Heart disease Other   . Breast cancer Maternal Aunt     Review of Systems  All other systems reviewed and are negative.   Exam:   BP 110/62 (BP Location: Left Arm, Patient Position: Sitting, Cuff Size: Normal)   Pulse 76   Temp (!) 97.3 F (36.3 C) (Temporal)   Resp 10   Ht 5' 6.25" (1.683 m)   Wt 142 lb (64.4 kg)    LMP 12/18/2019 (Within Days)   BMI 22.75 kg/m   Height: 5' 6.25" (168.3 cm)  Ht Readings from Last 3 Encounters:  01/04/20 5' 6.25" (1.683 m)  07/30/14 5' 6.5" (1.689 m)  12/17/11 5\' 7"  (1.702 m)    General appearance: alert, cooperative and appears stated age Head: Normocephalic, without obvious abnormality, atraumatic Neck: no adenopathy, supple, symmetrical, trachea midline and thyroid normal to inspection and palpation Lungs: clear to auscultation bilaterally Breasts: normal appearance, no masses or tenderness Heart: regular rate and rhythm Abdomen: soft, non-tender; bowel sounds normal; no masses,  no organomegaly Extremities: extremities normal, atraumatic, no cyanosis or edema Skin: Skin color, texture, turgor normal. No rashes or lesions Lymph nodes: Cervical, supraclavicular, and axillary nodes normal. No abnormal inguinal nodes palpated Neurologic: Grossly normal   Pelvic: External genitalia:  no lesions              Urethra:  normal appearing urethra with no masses, tenderness or lesions              Bartholins and Skenes: normal  Vagina: normal appearing vagina with normal color and discharge, no lesions              Cervix: no lesions              Pap taken: Yes.   Bimanual Exam:  Uterus:  normal size, contour, position, consistency, mobility, non-tender              Adnexa: normal adnexa and no mass, fullness, tenderness               Rectovaginal: Confirms               Anus:  normal sphincter tone, no lesions  Chaperone, Terence Lux, CMA, was present for exam.  A:  Well Woman with normal exam H/o menorrhagia with mild iron deficiency H/o NSVD x 6 Vasectomy for contraception  P:   Mammogram guidelines reviewed.  This is UTD. pap smear with HR HPV obtained today Release of records from Dr. Brigitte Pulse Treatment option for bleeding discussed.  Will start POP.  Information about endometrial ablation and IUD given as well. Return annually or  prn

## 2020-01-05 LAB — CYTOLOGY - PAP
Comment: NEGATIVE
Diagnosis: UNDETERMINED — AB
High risk HPV: POSITIVE — AB

## 2020-01-08 ENCOUNTER — Telehealth: Payer: Self-pay | Admitting: Obstetrics & Gynecology

## 2020-01-08 DIAGNOSIS — R87610 Atypical squamous cells of undetermined significance on cytologic smear of cervix (ASC-US): Secondary | ICD-10-CM

## 2020-01-08 NOTE — Telephone Encounter (Signed)
-----   Message from Jerene Bears, MD sent at 01/07/2020 10:14 PM EST ----- Please let pt know her pap was abnormal and high risk HPV was positive.  Needs colposcopy.  Thanks.  CC:  Zenovia Jordan, CMA

## 2020-01-08 NOTE — Telephone Encounter (Signed)
Patient is calling regarding results that she saw on MyChart.

## 2020-01-08 NOTE — Telephone Encounter (Signed)
Spoke with patient, advised of results as seen below per Dr. Hyacinth Meeker. Spouse vasectomy for contraceptive. LMP approximately 12/18/19. Colpo scheduled for 01/26/20 at 11:30am. Order placed for precert. Advised to take Motrin 800 mg with food and water one hour before procedure. Patient verbalizes understating and is agreeable.   Routing to provider for final review. Patient is agreeable to disposition. Will close encounter.  Cc: Soundra Pilon, [address] [place]

## 2020-01-10 ENCOUNTER — Telehealth: Payer: Self-pay | Admitting: Obstetrics & Gynecology

## 2020-01-10 NOTE — Telephone Encounter (Signed)
Call placed to convey benefits for colposcopy. °

## 2020-01-17 NOTE — Telephone Encounter (Signed)
Spoke with the patient and conveyed the benefits for colposcopy. Patient understands/agreeable with the benefits. Patient is aware of the cancellation policy.Appointment scheduled 01/26/20.

## 2020-01-25 ENCOUNTER — Other Ambulatory Visit: Payer: Self-pay

## 2020-01-26 ENCOUNTER — Encounter: Payer: Self-pay | Admitting: Obstetrics & Gynecology

## 2020-01-26 ENCOUNTER — Other Ambulatory Visit (HOSPITAL_COMMUNITY)
Admission: RE | Admit: 2020-01-26 | Discharge: 2020-01-26 | Disposition: A | Discharge status: Home / Self Care | Payer: BC Managed Care – PPO | Source: Ambulatory Visit | Attending: Obstetrics & Gynecology | Admitting: Obstetrics & Gynecology

## 2020-01-26 ENCOUNTER — Ambulatory Visit (INDEPENDENT_AMBULATORY_CARE_PROVIDER_SITE_OTHER): Payer: BC Managed Care – PPO | Admitting: Obstetrics & Gynecology

## 2020-01-26 DIAGNOSIS — R87610 Atypical squamous cells of undetermined significance on cytologic smear of cervix (ASC-US): Secondary | ICD-10-CM | POA: Diagnosis not present

## 2020-01-26 DIAGNOSIS — R87810 Cervical high risk human papillomavirus (HPV) DNA test positive: Secondary | ICD-10-CM | POA: Insufficient documentation

## 2020-01-26 DIAGNOSIS — N879 Dysplasia of cervix uteri, unspecified: Secondary | ICD-10-CM | POA: Diagnosis not present

## 2020-01-26 NOTE — Patient Instructions (Signed)

## 2020-01-26 NOTE — Progress Notes (Signed)
49 y.o. Married White female here for colposcopy with possible biopsies and/or ECC due to ASCUS Pap with +HR HPV obtained 01/04/2020 at [facility].  Pap smear reviewed.  Questions answered.  Procedure reviewed.  Consent obtained prior to start of procedure.    Prior evaluation/treatment:  none.  Patient's last menstrual period was 01/14/2020.          Sexually active: Yes.    The current method of family planning is vasectomy.     Patient has been counseled about results and procedure.  Risks and benefits have bene reviewed including immediate and/or delayed bleeding, infection, cervical scaring from procedure, possibility of needing additional follow up as well as treatment.  rare risks of missing a lesion discussed as well.  All questions answered.  Pt ready to proceed.  BP 120/70 (BP Location: Right Arm, Patient Position: Sitting, Cuff Size: Normal)   Pulse 68   Temp 98.2 F (36.8 C) (Temporal)   Resp 12   Wt 144 lb 9.6 oz (65.6 kg)   LMP 01/14/2020   BMI 23.16 kg/m   Physical Exam  Constitutional: She is oriented to person, place, and time. She appears well-developed and well-nourished.  Genitourinary:    Vagina normal.  There is no rash, tenderness, lesion or injury on the right labia. There is no rash, tenderness, lesion or injury on the left labia.     Lymphadenopathy:       Right: No inguinal adenopathy present.       Left: No inguinal adenopathy present.  Neurological: She is alert and oriented to person, place, and time.  Skin: Skin is warm and dry.  Psychiatric: She has a normal mood and affect.   Speculum placed.  3% acetic acid applied to cervix for >45 seconds.  Cervix visualized with both 7.5X and 15X magnification.  Green filter also used.  Lugols solution was used.  Findings:  Small area of AWE at 9 o'clock with some HPV changes noted on the cervix.  Biopsy:  Obtained at 9 o'clock.  ECC:  was performed.  Monsel's was needed.  Excellent hemostasis was present.  Pt tolerated  procedure well and all instruments were removed.  Findings noted above on picture of cervix.  Chaperone, Zenovia Jordan, CMA, was present during procedure.  Assessment:  ASCUS pap with +HR HPV  Plan:  Pathology results will be called to patient and follow-up planned pending results.

## 2020-01-29 LAB — SURGICAL PATHOLOGY

## 2020-04-14 ENCOUNTER — Other Ambulatory Visit: Payer: Self-pay | Admitting: Obstetrics & Gynecology

## 2020-04-15 NOTE — Telephone Encounter (Signed)
Medication refill request: Norlyda  Last [facility]:  01-04-20 [facility]  Next [facility]: 01-09-21 Last MMG (if hormonal medication request): 11-14-2019 density C/BIRADS 1 negative  Refill authorized: Today, please advise.   Medication pended for #84, 2RF. Please refill if appropriate.

## 2020-06-13 DIAGNOSIS — H5213 Myopia, bilateral: Secondary | ICD-10-CM | POA: Diagnosis not present

## 2020-07-22 DIAGNOSIS — Z20828 Contact with and (suspected) exposure to other viral communicable diseases: Secondary | ICD-10-CM | POA: Diagnosis not present

## 2020-08-21 DIAGNOSIS — D225 Melanocytic nevi of trunk: Secondary | ICD-10-CM | POA: Diagnosis not present

## 2020-08-21 DIAGNOSIS — L821 Other seborrheic keratosis: Secondary | ICD-10-CM | POA: Diagnosis not present

## 2020-10-30 ENCOUNTER — Other Ambulatory Visit: Payer: Self-pay | Admitting: Internal Medicine

## 2020-10-30 DIAGNOSIS — Z1231 Encounter for screening mammogram for malignant neoplasm of breast: Secondary | ICD-10-CM

## 2020-12-11 ENCOUNTER — Ambulatory Visit: Payer: BC Managed Care – PPO

## 2021-01-09 ENCOUNTER — Ambulatory Visit: Payer: Self-pay

## 2021-01-17 ENCOUNTER — Other Ambulatory Visit: Payer: Self-pay

## 2021-01-17 ENCOUNTER — Ambulatory Visit
Admission: RE | Admit: 2021-01-17 | Discharge: 2021-01-17 | Disposition: A | Discharge status: Home / Self Care | Payer: BC Managed Care – PPO | Source: Ambulatory Visit | Attending: Internal Medicine | Admitting: Internal Medicine

## 2021-01-17 DIAGNOSIS — Z1231 Encounter for screening mammogram for malignant neoplasm of breast: Secondary | ICD-10-CM

## 2021-02-25 DIAGNOSIS — S82201A Unspecified fracture of shaft of right tibia, initial encounter for closed fracture: Secondary | ICD-10-CM | POA: Diagnosis not present

## 2021-02-25 DIAGNOSIS — S82391A Other fracture of lower end of right tibia, initial encounter for closed fracture: Secondary | ICD-10-CM | POA: Diagnosis not present

## 2021-02-25 DIAGNOSIS — M25571 Pain in right ankle and joints of right foot: Secondary | ICD-10-CM | POA: Diagnosis not present

## 2021-03-06 ENCOUNTER — Ambulatory Visit (INDEPENDENT_AMBULATORY_CARE_PROVIDER_SITE_OTHER): Payer: BC Managed Care – PPO | Admitting: Obstetrics & Gynecology

## 2021-03-06 ENCOUNTER — Encounter (HOSPITAL_BASED_OUTPATIENT_CLINIC_OR_DEPARTMENT_OTHER): Payer: Self-pay | Admitting: Obstetrics & Gynecology

## 2021-03-06 ENCOUNTER — Other Ambulatory Visit: Payer: Self-pay

## 2021-03-06 ENCOUNTER — Other Ambulatory Visit (HOSPITAL_COMMUNITY)
Admission: RE | Admit: 2021-03-06 | Discharge: 2021-03-06 | Disposition: A | Discharge status: Home / Self Care | Payer: BC Managed Care – PPO | Source: Ambulatory Visit | Attending: Obstetrics & Gynecology | Admitting: Obstetrics & Gynecology

## 2021-03-06 VITALS — BP 128/82 | HR 68 | Resp 14

## 2021-03-06 DIAGNOSIS — R87610 Atypical squamous cells of undetermined significance on cytologic smear of cervix (ASC-US): Secondary | ICD-10-CM | POA: Insufficient documentation

## 2021-03-06 DIAGNOSIS — Z1211 Encounter for screening for malignant neoplasm of colon: Secondary | ICD-10-CM | POA: Diagnosis not present

## 2021-03-06 DIAGNOSIS — Z124 Encounter for screening for malignant neoplasm of cervix: Secondary | ICD-10-CM | POA: Insufficient documentation

## 2021-03-06 DIAGNOSIS — Z01419 Encounter for gynecological examination (general) (routine) without abnormal findings: Secondary | ICD-10-CM | POA: Diagnosis not present

## 2021-03-06 DIAGNOSIS — R87810 Cervical high risk human papillomavirus (HPV) DNA test positive: Secondary | ICD-10-CM

## 2021-03-06 NOTE — Progress Notes (Signed)
50 y.o. [id number] Married White or Caucasian female here for annual exam.  Cycles are fairly regular.  First day is heavy.  Had mid cycle bleeding this month.  Did not start norlyda last year.    LMP:  02/10/2021  PCP:  Dr. Clelia Croft      Sexually active: Yes.    The current method of family planning is vasectomy.    Exercising: Yes.    Smoker:  no  Health Maintenance: Pap:  today History of abnormal Pap:  Yes, ASCUS with HR HPV + MMG:  12/2020 Colonoscopy:  Guidelines reviewed TDaP:  07/30/2014 Screening Labs: will plan to do with Dr. Clelia Croft this year   reports that she has quit smoking. She has never used smokeless tobacco. She reports current alcohol use of about 7.0 - 14.0 standard drinks of alcohol per week. She reports that she does not use drugs.  Past Medical History:  Diagnosis Date  . History of anemia     Past Surgical History:  Procedure Laterality Date  . HERNIA REPAIR  2012   with diastasis repair  . LUMBAR DISC SURGERY  2016   Dr. Danielle Dess    Current Outpatient Medications  Medication Sig Dispense Refill  . loratadine (CLARITIN) 10 MG tablet Take 10 mg by mouth daily.    . Prenatal Vit-Fe Psac Cmplx-FA (POLY IRON PN PO) Take by mouth daily.     No current facility-administered medications for this visit.    Family History  Problem Relation Age of Onset  . Heart disease Father   . Parkinson's disease Father   . Colon cancer Maternal Grandfather        late 61's  . Heart disease Paternal Grandfather   . Heart disease Other   . Breast cancer Maternal Aunt     Review of Systems  All other systems reviewed and are negative.   Exam:   BP (!) 154/103   Pulse 68   Resp 14   SpO2 98%      General appearance: alert, cooperative and appears stated age Head: Normocephalic, without obvious abnormality, atraumatic Neck: no adenopathy, supple, symmetrical, trachea midline and thyroid normal to inspection and palpation Lungs: clear to auscultation  bilaterally Breasts: normal appearance, no masses or tenderness Heart: regular rate and rhythm Abdomen: soft, non-tender; bowel sounds normal; no masses,  no organomegaly Extremities: extremities normal, atraumatic, no cyanosis or edema Skin: Skin color, texture, turgor normal. No rashes or lesions Lymph nodes: Cervical, supraclavicular, and axillary nodes normal. No abnormal inguinal nodes palpated Neurologic: Grossly normal   Pelvic: External genitalia:  no lesions              Urethra:  normal appearing urethra with no masses, tenderness or lesions              Bartholins and Skenes: normal                 Vagina: normal appearing vagina with normal color and discharge, no lesions              Cervix: no lesions              Pap taken: Yes.   Bimanual Exam:  Uterus:  normal size, contour, position, consistency, mobility, non-tender              Adnexa: normal adnexa and no mass, fullness, tenderness               Rectovaginal: Confirms  Anus:  normal sphincter tone, no lesions  Chaperone, Beola Cord, CMA, was present for exam.  Assessment/Plan: 1. Well woman exam with routine gynecological exam - pap and HR HPV obtained today - referral for colonoscopy placed - MMG 2022 - will do lab work with Dr. Clelia Croft - vaccine updated  2. ASCUS with positive high risk HPV cervical - Cytology - PAP( [place])  3. Colon cancer screening - Ambulatory referral to Gastroenterology  4. Elevated BP today - recheck was normal.  Pt will start checking at home.

## 2021-03-11 LAB — CYTOLOGY - PAP
Comment: NEGATIVE
Diagnosis: NEGATIVE
High risk HPV: NEGATIVE

## 2021-04-03 ENCOUNTER — Ambulatory Visit: Payer: BC Managed Care – PPO | Admitting: Obstetrics & Gynecology

## 2021-06-12 DIAGNOSIS — H5213 Myopia, bilateral: Secondary | ICD-10-CM | POA: Diagnosis not present

## 2021-07-01 DIAGNOSIS — R000 Tachycardia, unspecified: Secondary | ICD-10-CM | POA: Diagnosis not present

## 2021-07-01 DIAGNOSIS — E611 Iron deficiency: Secondary | ICD-10-CM | POA: Diagnosis not present

## 2021-07-15 DIAGNOSIS — F4322 Adjustment disorder with anxiety: Secondary | ICD-10-CM | POA: Diagnosis not present

## 2021-07-18 DIAGNOSIS — B0239 Other herpes zoster eye disease: Secondary | ICD-10-CM | POA: Diagnosis not present

## 2021-07-19 DIAGNOSIS — S0501XA Injury of conjunctiva and corneal abrasion without foreign body, right eye, initial encounter: Secondary | ICD-10-CM | POA: Diagnosis not present

## 2021-07-28 DIAGNOSIS — H16011 Central corneal ulcer, right eye: Secondary | ICD-10-CM | POA: Diagnosis not present

## 2021-07-30 DIAGNOSIS — E7841 Elevated Lipoprotein(a): Secondary | ICD-10-CM | POA: Diagnosis not present

## 2021-07-30 DIAGNOSIS — E559 Vitamin D deficiency, unspecified: Secondary | ICD-10-CM | POA: Diagnosis not present

## 2021-07-30 DIAGNOSIS — F0390 Unspecified dementia without behavioral disturbance: Secondary | ICD-10-CM | POA: Diagnosis not present

## 2021-07-30 DIAGNOSIS — E7212 Methylenetetrahydrofolate reductase deficiency: Secondary | ICD-10-CM | POA: Diagnosis not present

## 2021-07-30 DIAGNOSIS — E8881 Metabolic syndrome: Secondary | ICD-10-CM | POA: Diagnosis not present

## 2021-07-30 DIAGNOSIS — R002 Palpitations: Secondary | ICD-10-CM | POA: Diagnosis not present

## 2021-07-30 DIAGNOSIS — E063 Autoimmune thyroiditis: Secondary | ICD-10-CM | POA: Diagnosis not present

## 2021-08-07 DIAGNOSIS — E7841 Elevated Lipoprotein(a): Secondary | ICD-10-CM | POA: Diagnosis not present

## 2021-08-07 DIAGNOSIS — E785 Hyperlipidemia, unspecified: Secondary | ICD-10-CM | POA: Diagnosis not present

## 2021-08-07 DIAGNOSIS — R002 Palpitations: Secondary | ICD-10-CM | POA: Diagnosis not present

## 2021-08-07 DIAGNOSIS — Z8249 Family history of ischemic heart disease and other diseases of the circulatory system: Secondary | ICD-10-CM | POA: Diagnosis not present

## 2021-08-20 DIAGNOSIS — L821 Other seborrheic keratosis: Secondary | ICD-10-CM | POA: Diagnosis not present

## 2021-08-20 DIAGNOSIS — L578 Other skin changes due to chronic exposure to nonionizing radiation: Secondary | ICD-10-CM | POA: Diagnosis not present

## 2021-08-20 DIAGNOSIS — D225 Melanocytic nevi of trunk: Secondary | ICD-10-CM | POA: Diagnosis not present

## 2022-03-27 ENCOUNTER — Other Ambulatory Visit (HOSPITAL_COMMUNITY)
Admission: RE | Admit: 2022-03-27 | Discharge: 2022-03-27 | Disposition: A | Discharge status: Home / Self Care | Payer: BC Managed Care – PPO | Source: Ambulatory Visit | Attending: Obstetrics & Gynecology | Admitting: Obstetrics & Gynecology

## 2022-03-27 ENCOUNTER — Encounter (HOSPITAL_BASED_OUTPATIENT_CLINIC_OR_DEPARTMENT_OTHER): Payer: Self-pay | Admitting: Obstetrics & Gynecology

## 2022-03-27 ENCOUNTER — Ambulatory Visit (INDEPENDENT_AMBULATORY_CARE_PROVIDER_SITE_OTHER): Payer: BC Managed Care – PPO | Admitting: Obstetrics & Gynecology

## 2022-03-27 VITALS — BP 162/100 | HR 76 | Ht 66.0 in | Wt 141.0 lb

## 2022-03-27 DIAGNOSIS — B977 Papillomavirus as the cause of diseases classified elsewhere: Secondary | ICD-10-CM

## 2022-03-27 DIAGNOSIS — Z01419 Encounter for gynecological examination (general) (routine) without abnormal findings: Secondary | ICD-10-CM | POA: Diagnosis not present

## 2022-03-27 DIAGNOSIS — Z8249 Family history of ischemic heart disease and other diseases of the circulatory system: Secondary | ICD-10-CM

## 2022-03-27 DIAGNOSIS — Z1231 Encounter for screening mammogram for malignant neoplasm of breast: Secondary | ICD-10-CM

## 2022-03-27 DIAGNOSIS — I10 Essential (primary) hypertension: Secondary | ICD-10-CM

## 2022-03-27 DIAGNOSIS — Z124 Encounter for screening for malignant neoplasm of cervix: Secondary | ICD-10-CM | POA: Insufficient documentation

## 2022-03-27 DIAGNOSIS — Z1211 Encounter for screening for malignant neoplasm of colon: Secondary | ICD-10-CM | POA: Diagnosis not present

## 2022-03-27 DIAGNOSIS — E785 Hyperlipidemia, unspecified: Secondary | ICD-10-CM

## 2022-03-27 NOTE — Progress Notes (Signed)
51 y.o. [id number] Married White or Caucasian female here for annual exam.  Pt's blood pressure is elevated today.  Has stopped taking her metoprolol.  Aware should start retaking this.  Has blood pressure cuff at home and will start checking BPs at home. ? ?Daughter is getting married in six weeks at their farm in [place].  This is between [place] and [place].   ? ?Still having menstrual cycles.  Cycles are still regular.  Flow lasts 5-7 days, typically.    ?Sexually active: Yes.    ?The current method of family planning is vasectomy.    ?Smoker:  no ? ?Health Maintenance: ?Pap:  03/06/21 neg, neg HR HPV ?History of abnormal Pap:  yes ?MMG:  01/17/21.  Pt aware this is normal. ?Colonoscopy:  did not go last year.  Wants referral again this year. ?BMD:   not indicated yet ?Screening Labs: does with Dr. Clelia Croft ? ? reports that she has quit smoking. She has never used smokeless tobacco. She reports current alcohol use of about 7.0 - 14.0 standard drinks per week. She reports that she does not use drugs. ? ?Past Medical History:  ?Diagnosis Date  ? History of anemia   ? ? ?Past Surgical History:  ?Procedure Laterality Date  ? HERNIA REPAIR  2012  ? with diastasis repair  ? LUMBAR DISC SURGERY  2016  ? Dr. Danielle Dess  ? ? ?Current Outpatient Medications  ?Medication Sig Dispense Refill  ? loratadine (CLARITIN) 10 MG tablet Take 10 mg by mouth daily. (Patient not taking: Reported on 03/27/2022)    ? metoprolol succinate (TOPROL-XL) 25 MG 24 hr tablet Take 25 mg by mouth. (Patient not taking: Reported on 03/27/2022)    ? ?No current facility-administered medications for this visit.  ? ? ?Family History  ?Problem Relation Age of Onset  ? Heart disease Paternal Grandfather   ? Colon cancer Maternal Grandfather   ?     late 74's  ? Heart disease Father   ? Parkinson's disease Father   ? Breast cancer Mother 35  ? Breast cancer Maternal Aunt   ? Heart disease Other   ? ? ?Review of Systems  ?All other systems reviewed and are  negative. ? ?Exam:   ?BP (!) 162/100   Pulse 76   Ht 5\' 6"  (1.676 m)   Wt 141 lb (64 kg)   LMP 03/14/2022   BMI 22.76 kg/m?   Height: 5\' 6"  (167.6 cm) ? ?General appearance: alert, cooperative and appears stated age ?Head: Normocephalic, without obvious abnormality, atraumatic ?Neck: no adenopathy, supple, symmetrical, trachea midline and thyroid normal to inspection and palpation ?Lungs: clear to auscultation bilaterally ?Breasts: normal appearance, no masses or tenderness ?Heart: regular rate and rhythm ?Abdomen: soft, non-tender; bowel sounds normal; no masses,  no organomegaly ?Extremities: extremities normal, atraumatic, no cyanosis or edema ?Skin: Skin color, texture, turgor normal. No rashes or lesions ?Lymph nodes: Cervical, supraclavicular, and axillary nodes normal. ?No abnormal inguinal nodes palpated ?Neurologic: Grossly normal ? ? ?Pelvic: External genitalia:  no lesions ?             Urethra:  normal appearing urethra with no masses, tenderness or lesions ?             Bartholins and Skenes: normal    ?             Vagina: normal appearing vagina with normal color and no discharge, no lesions ?  Cervix: no lesions ?             Pap taken: Yes.   ?Bimanual Exam:  Uterus:  normal size, contour, position, consistency, mobility, non-tender ?             Adnexa: normal adnexa and no mass, fullness, tenderness ?              Rectovaginal: Confirms ?              Anus:  normal sphincter tone, no lesions ? ?Chaperone, Ina Homes, CMA, was present for exam. ? ?Assessment/Plan: ?1. Well woman exam with routine gynecological exam ?- pap and HR HPV obtained today ?- pt aware MMG due ?- colonoscopy referral placed ?- BMD not indicated yet ?- lab work with Dr. Clelia Croft ?- vaccines reviewed/updated ? ?2. Cervical cancer screening ?- Cytology - PAP( [place]) ? ?3. High risk HPV infection ? ?4. Colon cancer screening ?- Ambulatory referral to Gastroenterology ? ?5. Family history of coronary artery  disease ? ?6. Dyslipidemia ? ?7. Essential hypertension ?- pt is going to restart her metoprolol and she is going to start checking her blood pressure ? ?

## 2022-04-02 LAB — CYTOLOGY - PAP
Comment: NEGATIVE
Diagnosis: NEGATIVE
High risk HPV: NEGATIVE

## 2022-04-10 ENCOUNTER — Ambulatory Visit (HOSPITAL_BASED_OUTPATIENT_CLINIC_OR_DEPARTMENT_OTHER)
Admission: RE | Admit: 2022-04-10 | Discharge: 2022-04-10 | Disposition: A | Discharge status: Home / Self Care | Payer: BC Managed Care – PPO | Source: Ambulatory Visit | Attending: Obstetrics & Gynecology | Admitting: Obstetrics & Gynecology

## 2022-04-10 DIAGNOSIS — Z1231 Encounter for screening mammogram for malignant neoplasm of breast: Secondary | ICD-10-CM | POA: Diagnosis not present

## 2022-08-25 ENCOUNTER — Other Ambulatory Visit: Payer: Self-pay | Admitting: Orthopedic Surgery

## 2022-08-25 DIAGNOSIS — R2232 Localized swelling, mass and lump, left upper limb: Secondary | ICD-10-CM

## 2022-08-26 DIAGNOSIS — D225 Melanocytic nevi of trunk: Secondary | ICD-10-CM | POA: Diagnosis not present

## 2022-08-26 DIAGNOSIS — L821 Other seborrheic keratosis: Secondary | ICD-10-CM | POA: Diagnosis not present

## 2022-08-26 DIAGNOSIS — D485 Neoplasm of uncertain behavior of skin: Secondary | ICD-10-CM | POA: Diagnosis not present

## 2022-08-26 DIAGNOSIS — L578 Other skin changes due to chronic exposure to nonionizing radiation: Secondary | ICD-10-CM | POA: Diagnosis not present

## 2022-08-27 ENCOUNTER — Ambulatory Visit
Admission: RE | Admit: 2022-08-27 | Discharge: 2022-08-27 | Disposition: A | Discharge status: Home / Self Care | Payer: BC Managed Care – PPO | Source: Ambulatory Visit | Attending: Orthopedic Surgery | Admitting: Orthopedic Surgery

## 2022-08-27 DIAGNOSIS — R2232 Localized swelling, mass and lump, left upper limb: Secondary | ICD-10-CM

## 2022-11-03 DIAGNOSIS — L988 Other specified disorders of the skin and subcutaneous tissue: Secondary | ICD-10-CM | POA: Diagnosis not present

## 2022-11-03 DIAGNOSIS — D485 Neoplasm of uncertain behavior of skin: Secondary | ICD-10-CM | POA: Diagnosis not present

## 2023-04-08 ENCOUNTER — Ambulatory Visit (HOSPITAL_BASED_OUTPATIENT_CLINIC_OR_DEPARTMENT_OTHER): Payer: BC Managed Care – PPO | Admitting: Obstetrics & Gynecology

## 2023-06-25 ENCOUNTER — Other Ambulatory Visit: Payer: Self-pay | Admitting: Family Medicine

## 2023-06-25 DIAGNOSIS — E7849 Other hyperlipidemia: Secondary | ICD-10-CM

## 2023-07-05 ENCOUNTER — Ambulatory Visit
Admission: RE | Admit: 2023-07-05 | Discharge: 2023-07-05 | Disposition: A | Discharge status: Home / Self Care | Payer: No Typology Code available for payment source | Source: Ambulatory Visit | Attending: Family Medicine | Admitting: Family Medicine

## 2023-07-05 DIAGNOSIS — E7849 Other hyperlipidemia: Secondary | ICD-10-CM

## 2023-07-23 ENCOUNTER — Other Ambulatory Visit (HOSPITAL_BASED_OUTPATIENT_CLINIC_OR_DEPARTMENT_OTHER): Payer: Self-pay | Admitting: Internal Medicine

## 2023-07-23 DIAGNOSIS — Z1231 Encounter for screening mammogram for malignant neoplasm of breast: Secondary | ICD-10-CM

## 2023-08-07 ENCOUNTER — Ambulatory Visit (HOSPITAL_BASED_OUTPATIENT_CLINIC_OR_DEPARTMENT_OTHER)
Admission: RE | Admit: 2023-08-07 | Discharge: 2023-08-07 | Disposition: A | Discharge status: Home / Self Care | Payer: BC Managed Care – PPO | Source: Ambulatory Visit | Attending: Internal Medicine | Admitting: Internal Medicine

## 2023-08-07 DIAGNOSIS — Z1231 Encounter for screening mammogram for malignant neoplasm of breast: Secondary | ICD-10-CM | POA: Diagnosis not present

## 2023-08-09 DIAGNOSIS — H524 Presbyopia: Secondary | ICD-10-CM | POA: Diagnosis not present

## 2023-08-09 DIAGNOSIS — H52223 Regular astigmatism, bilateral: Secondary | ICD-10-CM | POA: Diagnosis not present

## 2023-08-09 DIAGNOSIS — H5213 Myopia, bilateral: Secondary | ICD-10-CM | POA: Diagnosis not present

## 2023-08-30 ENCOUNTER — Ambulatory Visit (HOSPITAL_BASED_OUTPATIENT_CLINIC_OR_DEPARTMENT_OTHER): Payer: BC Managed Care – PPO | Admitting: Obstetrics & Gynecology

## 2023-09-07 NOTE — Progress Notes (Unsigned)
52 y.o. [id number] Married White or Caucasian female here for annual exam.  Doing well.  Cycles are regular.  Flow lasts 5-7.  Does occasionally have hot flashes.  Family is doing well.   Does have appointment scheduled for colonoscopy with Dr. Barron Alvine.   No LMP recorded.          Sexually active: Yes.    The current method of family planning is post menopausal status.    Smoker:  no  Health Maintenance: Pap:  03/27/2022 Negative History of abnormal Pap:  2021 MMG:  08/07/2023 Negative Colonoscopy:  appt is scheduled with Dr. Barron Alvine  Screening Labs: done with Dr. Artis Flock   reports that she has quit smoking. She has never used smokeless tobacco. She reports current alcohol use of about 7.0 - 14.0 standard drinks of alcohol per week. She reports that she does not use drugs.  Past Medical History:  Diagnosis Date   History of anemia     Past Surgical History:  Procedure Laterality Date   HERNIA REPAIR  2012   with diastasis repair   LUMBAR DISC SURGERY  2016   Dr. Danielle Dess    Current Outpatient Medications  Medication Sig Dispense Refill   hydrochlorothiazide (HYDRODIURIL) 25 MG tablet Take 25 mg by mouth daily.     No current facility-administered medications for this visit.    Family History  Problem Relation Age of Onset   Breast cancer Mother 95   Cancer Mother 76       breast cancer   Heart disease Father    Parkinson's disease Father    Colon cancer Maternal Grandfather        late 70's   Heart disease Paternal Grandfather    Breast cancer Maternal Aunt    Heart disease Other     ROS: Constitutional: negative Genitourinary:negative  Exam:   BP (!) 142/100 (BP Location: Right Arm, Patient Position: Sitting, Cuff Size: Large)   Pulse 76   Ht 5\' 6"  (1.676 m)   Wt 148 lb 9.6 oz (67.4 kg)   BMI 23.98 kg/m   Height: 5\' 6"  (167.6 cm)  General appearance: alert, cooperative and appears stated age Head: Normocephalic, without obvious abnormality,  atraumatic Neck: no adenopathy, supple, symmetrical, trachea midline and thyroid normal to inspection and palpation Lungs: clear to auscultation bilaterally Breasts: normal appearance, no masses or tenderness Heart: regular rate and rhythm Abdomen: soft, non-tender; bowel sounds normal; no masses,  no organomegaly Extremities: extremities normal, atraumatic, no cyanosis or edema Skin: Skin color, texture, turgor normal. No rashes or lesions Lymph nodes: Cervical, supraclavicular, and axillary nodes normal. No abnormal inguinal nodes palpated Neurologic: Grossly normal   Pelvic: External genitalia:  no lesions              Urethra:  normal appearing urethra with no masses, tenderness or lesions              Bartholins and Skenes: normal                 Vagina: normal appearing vagina with normal color and no discharge, no lesions              Cervix: no lesions              Pap taken: Yes.   Bimanual Exam:  Uterus:  normal size, contour, position, consistency, mobility, non-tender              Adnexa: normal adnexa and no mass, fullness, tenderness  Rectovaginal: Confirms               Anus:  normal sphincter tone, no lesions  Chaperone, Hendricks Milo, CMA, was present for exam.  Assessment/Plan: 1. Well woman exam with routine gynecological exam - Pap smear obtained today per pt request - Mammogram 08/2023 - Colonoscopy scheduled - lab work done with PCP, Dr. Artis Flock - vaccines reviewed/updated  2. Cervical cancer screening - Cytology - PAP( [place])  3. Family history of coronary artery disease - saw Dr. Tiburcio Pea, cardiology, with [facility]  4. Essential hypertension - on hydrochlorothiazide 25mg  daily

## 2023-09-08 ENCOUNTER — Other Ambulatory Visit (HOSPITAL_COMMUNITY)
Admission: RE | Admit: 2023-09-08 | Discharge: 2023-09-08 | Disposition: A | Discharge status: Home / Self Care | Payer: BC Managed Care – PPO | Source: Ambulatory Visit | Attending: Obstetrics & Gynecology | Admitting: Obstetrics & Gynecology

## 2023-09-08 ENCOUNTER — Ambulatory Visit (HOSPITAL_BASED_OUTPATIENT_CLINIC_OR_DEPARTMENT_OTHER): Payer: BC Managed Care – PPO | Admitting: Obstetrics & Gynecology

## 2023-09-08 ENCOUNTER — Encounter (HOSPITAL_BASED_OUTPATIENT_CLINIC_OR_DEPARTMENT_OTHER): Payer: Self-pay | Admitting: Obstetrics & Gynecology

## 2023-09-08 VITALS — BP 130/88 | HR 76 | Ht 66.0 in | Wt 148.6 lb

## 2023-09-08 DIAGNOSIS — I10 Essential (primary) hypertension: Secondary | ICD-10-CM | POA: Diagnosis not present

## 2023-09-08 DIAGNOSIS — Z01419 Encounter for gynecological examination (general) (routine) without abnormal findings: Secondary | ICD-10-CM

## 2023-09-08 DIAGNOSIS — Z8249 Family history of ischemic heart disease and other diseases of the circulatory system: Secondary | ICD-10-CM

## 2023-09-08 DIAGNOSIS — Z124 Encounter for screening for malignant neoplasm of cervix: Secondary | ICD-10-CM | POA: Diagnosis not present

## 2023-09-14 LAB — CYTOLOGY - PAP: Diagnosis: NEGATIVE

## 2023-10-01 IMAGING — MG MM DIGITAL SCREENING BILAT W/ TOMO AND CAD
8 series · 9 of 24 positions shown · non-contrast
Comparison: Previous exam(s).

CLINICAL DATA: Screening.

EXAM:
DIGITAL SCREENING BILATERAL MAMMOGRAM WITH TOMOSYNTHESIS AND CAD
TECHNIQUE: Bilateral screening digital craniocaudal and mediolateral oblique
mammograms were obtained. Bilateral screening digital breast
tomosynthesis was performed. The images were evaluated with
computer-aided detection.

[R MLO synth-2D]
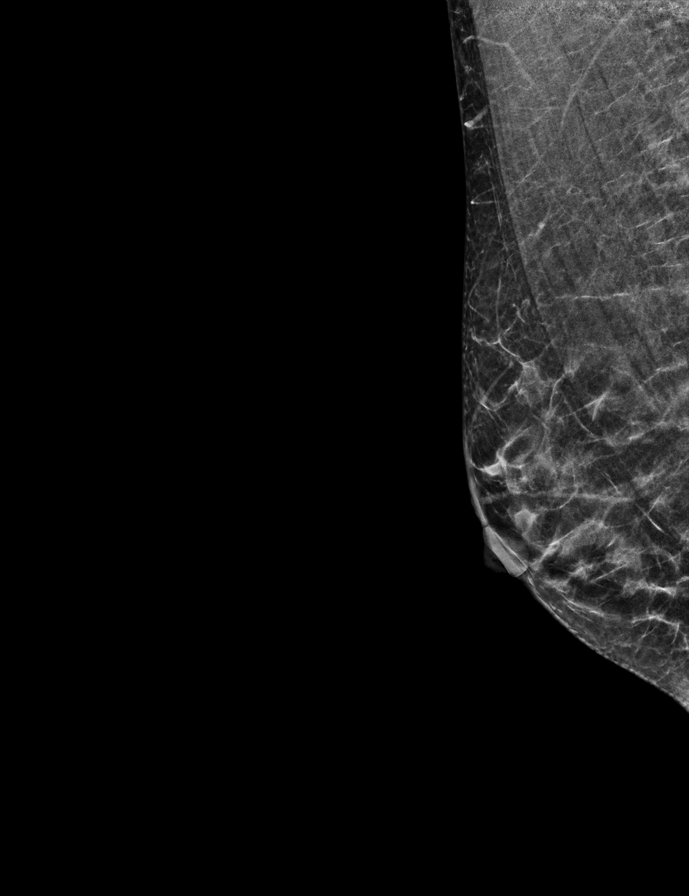

[L CC synth-2D]
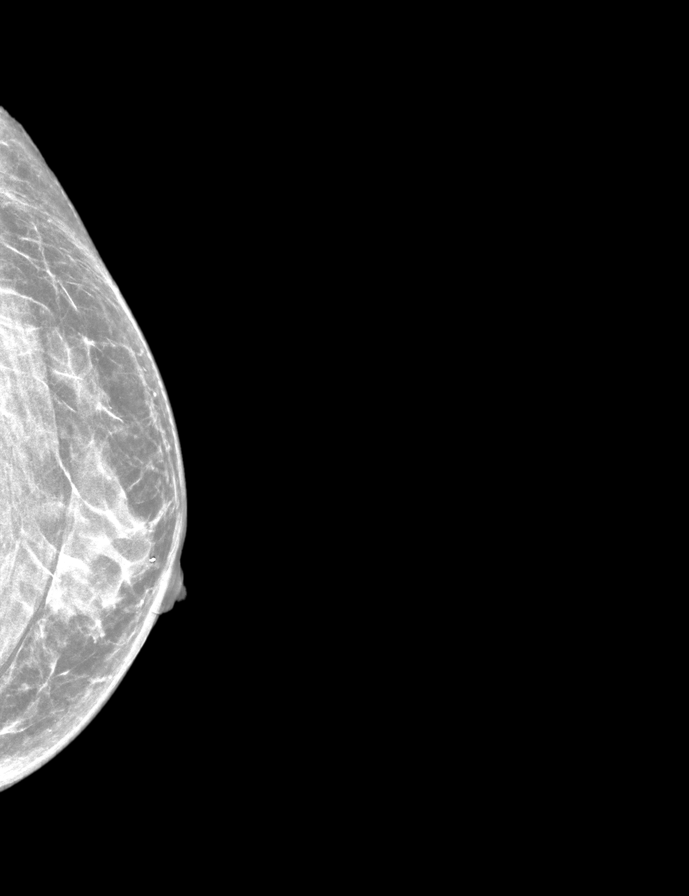

[L MLO synth-2D]
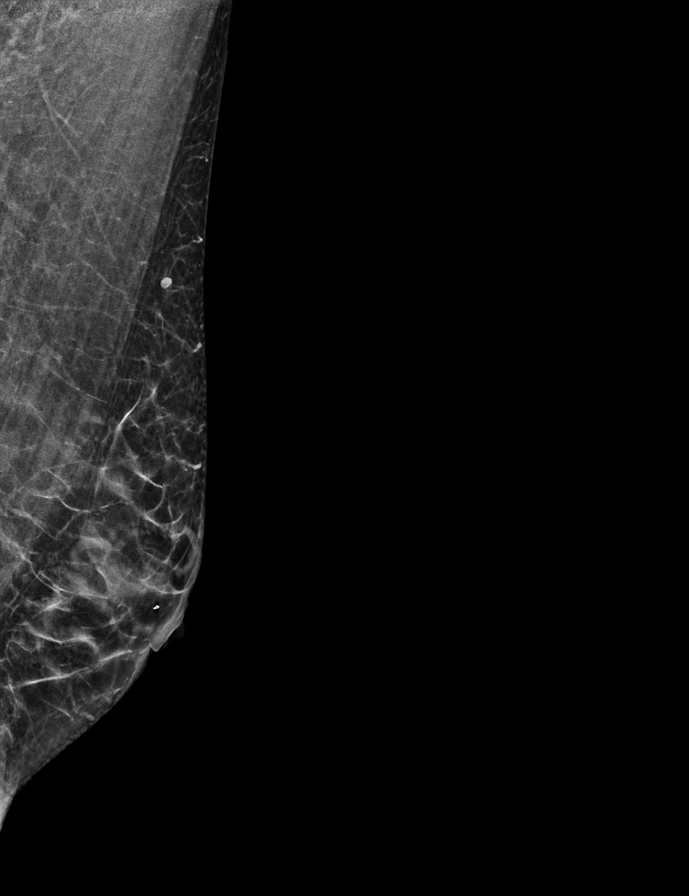

[R CC synth-2D]
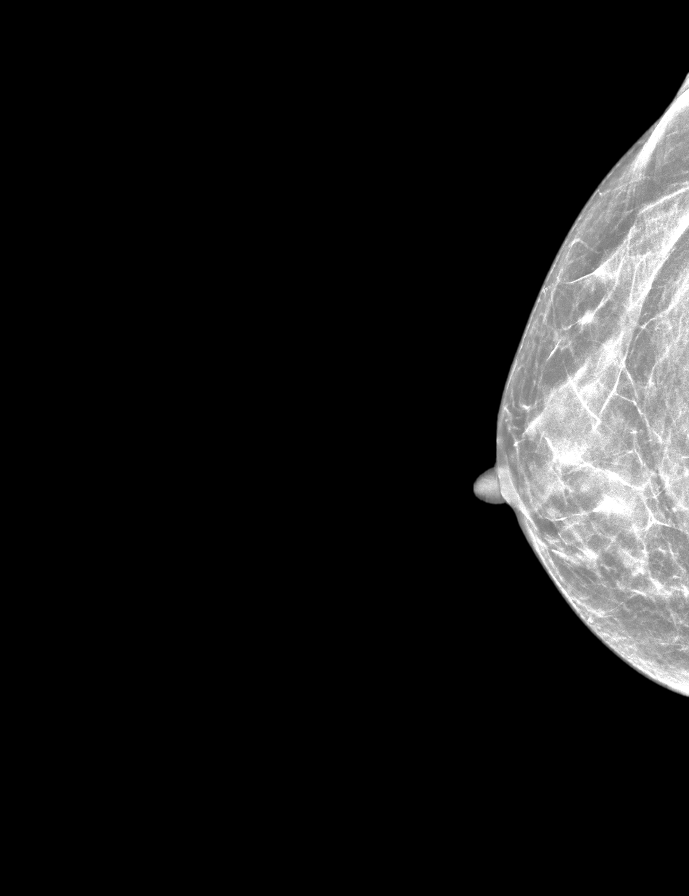

[R CC tomo · 2 of 42 frames shown]
[frame 14/42]
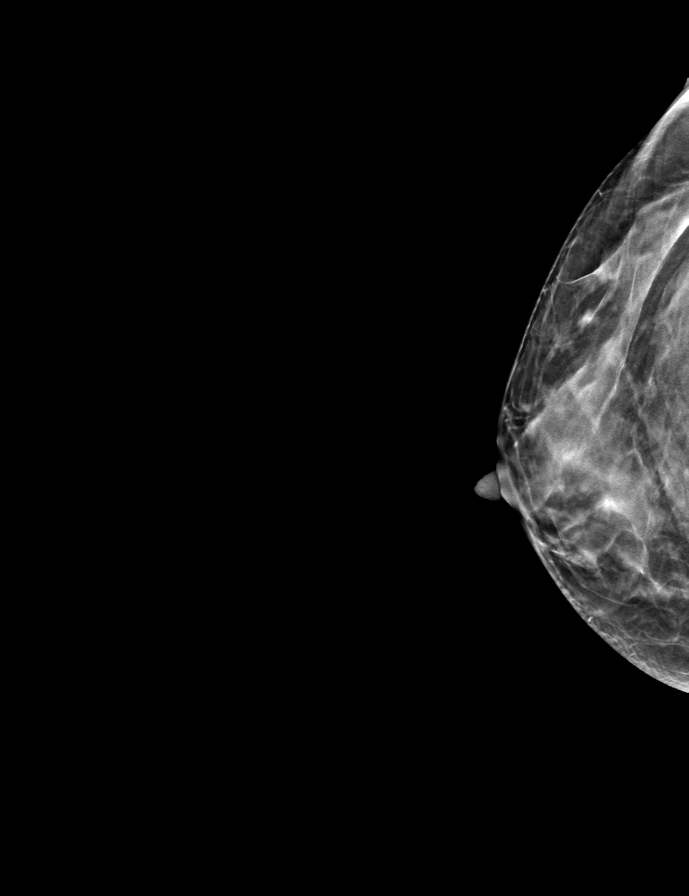
[frame 21/42]
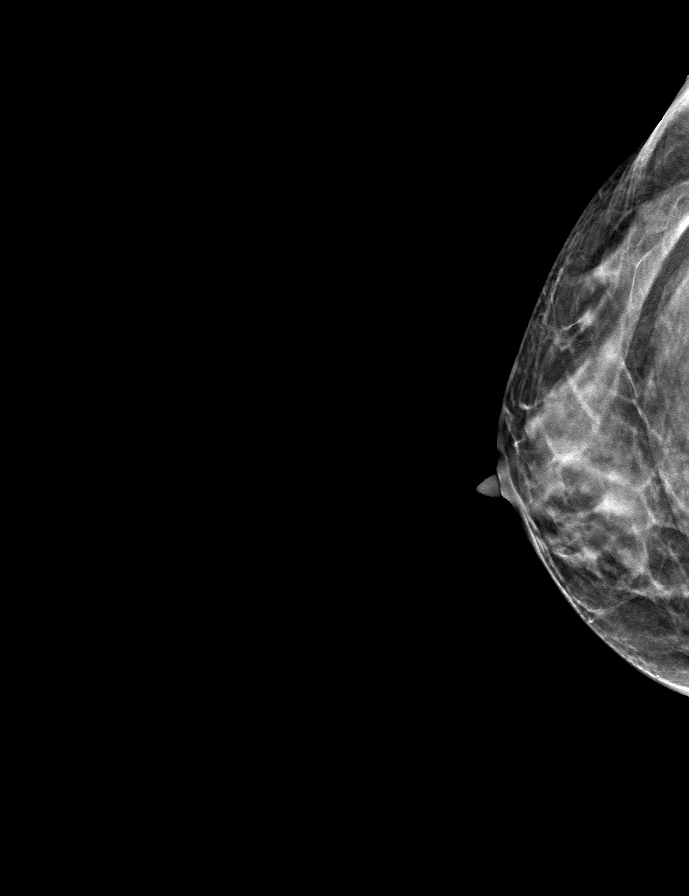

[L MLO tomo · tomo slice 23/46.0]
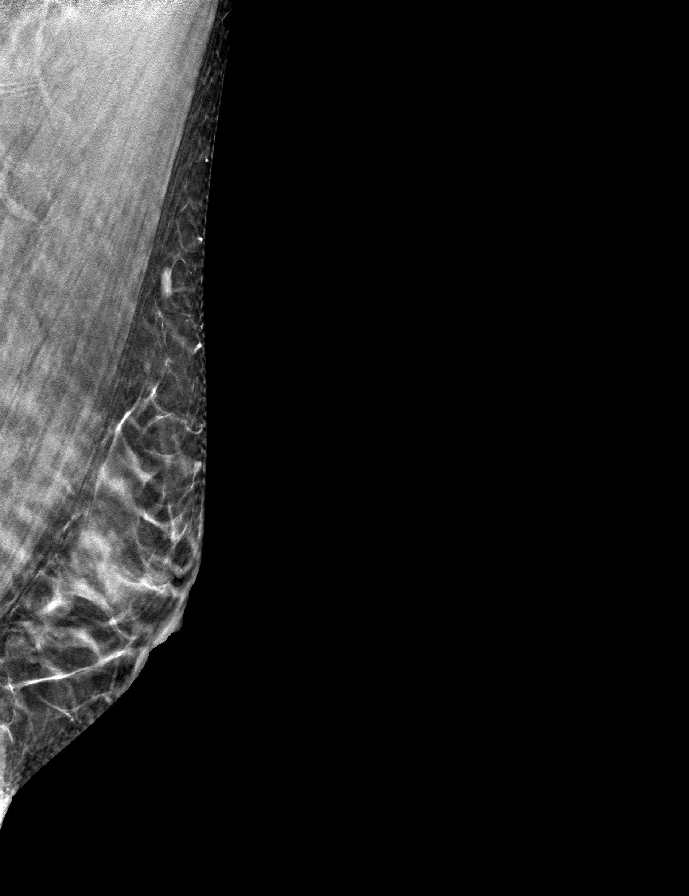

[L CC tomo · tomo slice 25/48.0]
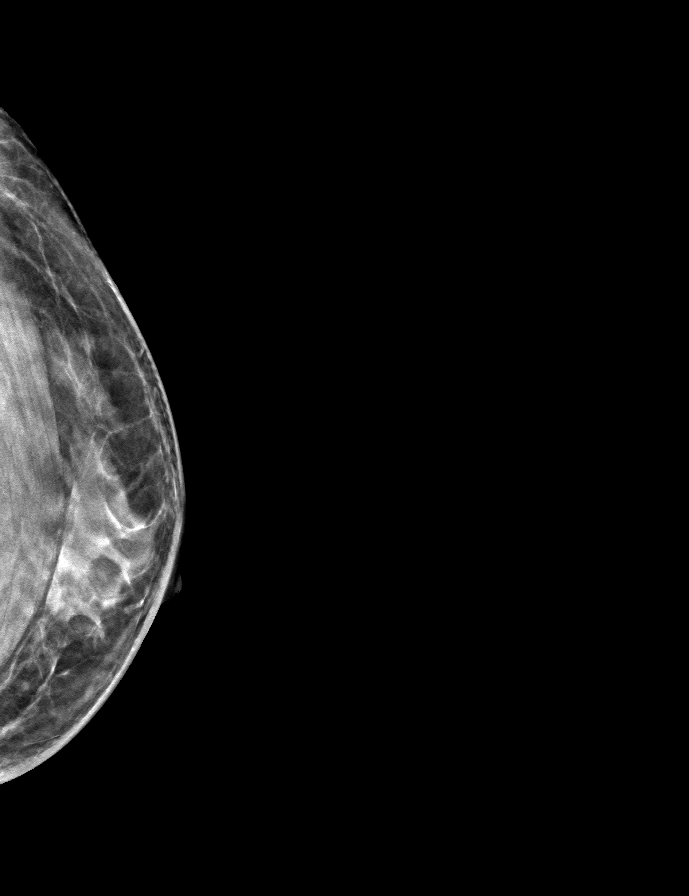

[R MLO tomo · tomo slice 21/40.0]
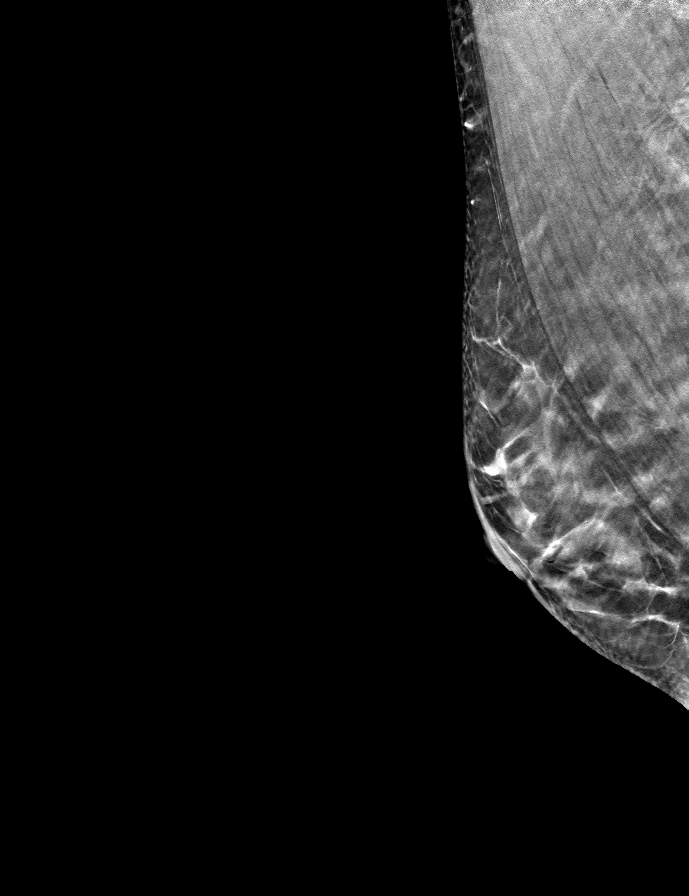

[9 of 24 positions shown; findings below may reference images not displayed]

ACR Breast Density Category c: The breast tissue is heterogeneously
dense, which may obscure small masses.
FINDINGS: There are no findings suspicious for malignancy.
IMPRESSION: No mammographic evidence of malignancy. A result letter of this
screening mammogram will be mailed directly to the patient.

RECOMMENDATION:
Screening mammogram in one year. (Code:Q3-W-BC3)

BI-RADS CATEGORY  1: Negative.

## 2023-10-11 ENCOUNTER — Ambulatory Visit (AMBULATORY_SURGERY_CENTER): Payer: BC Managed Care – PPO

## 2023-10-11 VITALS — Ht 66.0 in | Wt 147.0 lb

## 2023-10-11 DIAGNOSIS — Z1211 Encounter for screening for malignant neoplasm of colon: Secondary | ICD-10-CM

## 2023-10-11 MED ORDER — NA SULFATE-K SULFATE-MG SULF 17.5-3.13-1.6 GM/177ML PO SOLN: 1.0000 | Freq: Once | ORAL | 0 refills | Status: DC

## 2023-10-11 NOTE — Progress Notes (Signed)
No egg or soy allergy known to patient  PONV with past sedation for procedures/surgeries  Patient denies ever being told they had issues or difficulty with intubation  No FH of Malignant Hyperthermia Pt is not on diet pills Pt is not on  home 02  Pt is not on blood thinners  Pt denies issues with constipation  No A fib or A flutter Have any cardiac testing pending--no  LOA: independent  Prep: suprep   Patient's chart reviewed by Cathlyn Parsons CNRA prior to previsit and patient appropriate for the [facility].  Previsit completed and red dot placed by patient's name on their procedure day (on provider's schedule).     PV competed with patient. Prep instructions sent via mychart and home address. Goodrx coupon for [employer] provided to use for price reduction if needed.

## 2023-10-21 ENCOUNTER — Encounter: Payer: Self-pay | Admitting: Gastroenterology

## 2023-11-04 ENCOUNTER — Other Ambulatory Visit: Payer: Self-pay

## 2023-11-04 ENCOUNTER — Telehealth: Payer: Self-pay | Admitting: Gastroenterology

## 2023-11-04 DIAGNOSIS — Z1211 Encounter for screening for malignant neoplasm of colon: Secondary | ICD-10-CM

## 2023-11-04 MED ORDER — NA SULFATE-K SULFATE-MG SULF 17.5-3.13-1.6 GM/177ML PO SOLN: 1.0000 | Freq: Once | ORAL | 0 refills | Status: AC

## 2023-11-04 NOTE — Telephone Encounter (Signed)
Rx sent VM left making patient aware

## 2023-11-04 NOTE — Telephone Encounter (Signed)
PT is calling to have her prep medication, Suprep called into [facility] on [address]. Please advise.

## 2023-11-08 ENCOUNTER — Encounter: Payer: Self-pay | Admitting: Gastroenterology

## 2023-11-08 ENCOUNTER — Ambulatory Visit (AMBULATORY_SURGERY_CENTER): Payer: BC Managed Care – PPO | Admitting: Gastroenterology

## 2023-11-08 VITALS — BP 123/73 | HR 79 | Temp 98.4°F | Resp 13 | Ht 66.0 in | Wt 147.0 lb

## 2023-11-08 DIAGNOSIS — K640 First degree hemorrhoids: Secondary | ICD-10-CM

## 2023-11-08 DIAGNOSIS — D124 Benign neoplasm of descending colon: Secondary | ICD-10-CM

## 2023-11-08 DIAGNOSIS — D125 Benign neoplasm of sigmoid colon: Secondary | ICD-10-CM | POA: Diagnosis not present

## 2023-11-08 DIAGNOSIS — K648 Other hemorrhoids: Secondary | ICD-10-CM

## 2023-11-08 DIAGNOSIS — D122 Benign neoplasm of ascending colon: Secondary | ICD-10-CM

## 2023-11-08 DIAGNOSIS — Z1211 Encounter for screening for malignant neoplasm of colon: Secondary | ICD-10-CM

## 2023-11-08 DIAGNOSIS — K5730 Diverticulosis of large intestine without perforation or abscess without bleeding: Secondary | ICD-10-CM | POA: Diagnosis not present

## 2023-11-08 DIAGNOSIS — D123 Benign neoplasm of transverse colon: Secondary | ICD-10-CM

## 2023-11-08 MED ORDER — SODIUM CHLORIDE 0.9 % IV SOLN: 500.0000 mL | Freq: Once | INTRAVENOUS | Status: DC

## 2023-11-08 NOTE — Op Note (Signed)
[facility] Patient Name: Tiffany Willis Procedure Date: 11/08/2023 8:50 AM MRN: [id number] Endoscopist: Doristine Locks , MD, [id number] Age: 52 Referring MD:  Date of Birth: 12/02/1970 Gender: Female Account #: [id number] Procedure:                Colonoscopy Indications:              Screening for colorectal malignant neoplasm, This                            is the patient's first colonoscopy Medicines:                Monitored Anesthesia Care Procedure:                Pre-Anesthesia Assessment:                           - Prior to the procedure, a History and Physical                            was performed, and patient medications and                            allergies were reviewed. The patient's tolerance of                            previous anesthesia was also reviewed. The risks                            and benefits of the procedure and the sedation                            options and risks were discussed with the patient.                            All questions were answered, and informed consent                            was obtained. Prior Anticoagulants: The patient has                            taken no anticoagulant or antiplatelet agents. ASA                            Grade Assessment: II - A patient with mild systemic                            disease. After reviewing the risks and benefits,                            the patient was deemed in satisfactory condition to                            undergo the procedure.  After obtaining informed consent, the colonoscope                            was passed under direct vision. Throughout the                            procedure, the patient's blood pressure, pulse, and                            oxygen saturations were monitored continuously. The                            Olympus Scope [id number] was introduced through the                            anus and advanced to  the the cecum, identified by                            appendiceal orifice and ileocecal valve. The                            colonoscopy was performed without difficulty. The                            patient tolerated the procedure well. The quality                            of the bowel preparation was good. The ileocecal                            valve, appendiceal orifice, and rectum were                            photographed. Scope In: 9:44:57 AM Scope Out: 10:12:45 AM Scope Withdrawal Time: 0 hours 24 minutes 35 seconds  Total Procedure Duration: 0 hours 27 minutes 48 seconds  Findings:                 The perianal and digital rectal examinations were                            normal.                           Two sessile polyps were found in the ascending                            colon. The polyps were 2 to 3 mm in size. These                            polyps were removed with a cold snare. Resection                            and retrieval were complete. Estimated blood loss  was minimal.                           A 15 mm polyp was found in the proximal transverse                            colon. The polyp was sessile. The polyp was removed                            with a cold snare. Resection and retrieval were                            complete. Estimated blood loss was minimal.                           Two sessile polyps were found in the descending                            colon. The polyps were 3 to 4 mm in size. These                            polyps were removed with a cold snare. Resection                            and retrieval were complete. Estimated blood loss                            was minimal.                           An 8 mm polyp was found in the sigmoid colon. The                            polyp was semi-pedunculated. The polyp was removed                            with a cold snare. Resection and retrieval  were                            complete. Estimated blood loss was minimal.                           Multiple small-mouthed diverticula were found in                            the sigmoid colon.                           Non-bleeding internal hemorrhoids were found during                            retroflexion. The hemorrhoids were small.  Retroflexion in the right colon was performed. Complications:            No immediate complications. Estimated Blood Loss:     Estimated blood loss was minimal. Impression:               - Two 2 to 3 mm polyps in the ascending colon,                            removed with a cold snare. Resected and retrieved.                           - One 15 mm polyp in the proximal transverse colon,                            removed with a cold snare. Resected and retrieved.                           - Two 3 to 4 mm polyps in the descending colon,                            removed with a cold snare. Resected and retrieved.                           - One 8 mm polyp in the sigmoid colon, removed with                            a cold snare. Resected and retrieved.                           - Diverticulosis in the sigmoid colon.                           - Non-bleeding internal hemorrhoids. Recommendation:           - Patient has a contact number available for                            emergencies. The signs and symptoms of potential                            delayed complications were discussed with the                            patient. Return to normal activities tomorrow.                            Written discharge instructions were provided to the                            patient.                           - Resume previous diet.                           -  Continue present medications.                           - Await pathology results.                           - Repeat colonoscopy for surveillance based on                             pathology results.                           - Return to GI office PRN. Doristine Locks, MD 11/08/2023 10:18:12 AM

## 2023-11-08 NOTE — Progress Notes (Signed)
Pt's states no medical or surgical changes since previsit or office visit. 

## 2023-11-08 NOTE — Progress Notes (Signed)
Called to room to assist during endoscopic procedure.  Patient ID and intended procedure confirmed with present staff. Received instructions for my participation in the procedure from the performing physician.  

## 2023-11-08 NOTE — Patient Instructions (Signed)
Handouts provided on polyps, diverticulosis and hemorrhoids.  Resume previous diet.  Continue present medications.  Await pathology results.  Repeat colonoscopy for surveillance based on pathology results.  Return to GI office as needed.   YOU HAD AN ENDOSCOPIC PROCEDURE TODAY AT THE [facility]:   Refer to the procedure report that was given to you for any specific questions about what was found during the examination.  If the procedure report does not answer your questions, please call your gastroenterologist to clarify.  If you requested that your care partner not be given the details of your procedure findings, then the procedure report has been included in a sealed envelope for you to review at your convenience later.  YOU SHOULD EXPECT: Some feelings of bloating in the abdomen. Passage of more gas than usual.  Walking can help get rid of the air that was put into your GI tract during the procedure and reduce the bloating. If you had a lower endoscopy (such as a colonoscopy or flexible sigmoidoscopy) you may notice spotting of blood in your stool or on the toilet paper. If you underwent a bowel prep for your procedure, you may not have a normal bowel movement for a few days.  Please Note:  You might notice some irritation and congestion in your nose or some drainage.  This is from the oxygen used during your procedure.  There is no need for concern and it should clear up in a day or so.  SYMPTOMS TO REPORT IMMEDIATELY:  Following lower endoscopy (colonoscopy or flexible sigmoidoscopy):  Excessive amounts of blood in the stool  Significant tenderness or worsening of abdominal pains  Swelling of the abdomen that is new, acute  Fever of 100F or higher  For urgent or emergent issues, a gastroenterologist can be reached at any hour by calling [phone]. Do not use MyChart messaging for urgent concerns.    DIET:  We do recommend a small meal at first, but then you may  proceed to your regular diet.  Drink plenty of fluids but you should avoid alcoholic beverages for 24 hours.  ACTIVITY:  You should plan to take it easy for the rest of today and you should NOT DRIVE or use heavy machinery until tomorrow (because of the sedation medicines used during the test).    FOLLOW UP: Our staff will call the number listed on your records the next business day following your procedure.  We will call around 7:15- 8:00 am to check on you and address any questions or concerns that you may have regarding the information given to you following your procedure. If we do not reach you, we will leave a message.     If any biopsies were taken you will be contacted by phone or by letter within the next 1-3 weeks.  Please call us at [phone] if you have not heard about the biopsies in 3 weeks.    SIGNATURES/CONFIDENTIALITY: You and/or your care partner have signed paperwork which will be entered into your electronic medical record.  These signatures attest to the fact that that the information above on your After Visit Summary has been reviewed and is understood.  Full responsibility of the confidentiality of this discharge information lies with you and/or your care-partner.

## 2023-11-08 NOTE — Progress Notes (Signed)
GASTROENTEROLOGY PROCEDURE H&P NOTE   Primary Care Physician: Orland Mustard, MD    Reason for Procedure:  Colon Cancer screening  Plan:    Colonoscopy  Patient is appropriate for endoscopic procedure(s) in the ambulatory ([facility]) setting.  The nature of the procedure, as well as the risks, benefits, and alternatives were carefully and thoroughly reviewed with the patient. Ample time for discussion and questions allowed. The patient understood, was satisfied, and agreed to proceed.     HPI: Tiffany Willis is a 52 y.o. female who presents for colonoscopy for routine Colon Cancer screening.  No active GI symptoms.    Fhx notable for MGF in his 57's. Patient is otherwise without complaints or active issues today.  Past Medical History:  Diagnosis Date   Anemia    History of anemia    Hypertension     Past Surgical History:  Procedure Laterality Date   HERNIA REPAIR  2012   with diastasis repair   LUMBAR DISC SURGERY  2016   Dr. Danielle Dess    Prior to Admission medications   Medication Sig Start Date End Date Taking? Authorizing Provider  hydrochlorothiazide (HYDRODIURIL) 25 MG tablet Take 25 mg by mouth daily. 06/25/23  Yes [provider]    Current Outpatient Medications  Medication Sig Dispense Refill   hydrochlorothiazide (HYDRODIURIL) 25 MG tablet Take 25 mg by mouth daily.     Current Facility-Administered Medications  Medication Dose Route Frequency Provider Last Rate Last Admin   0.9 %  sodium chloride infusion  500 mL Intravenous Once Lidia Clavijo V, DO        Allergies as of 11/08/2023   (No Known Allergies)    Family History  Problem Relation Age of Onset   Breast cancer Mother 49   Cancer Mother 26       breast cancer   Heart disease Father    Parkinson's disease Father    Breast cancer Maternal Aunt    Colon cancer Maternal Grandfather        late 70's   Heart disease Paternal Grandfather    Heart disease Other    Rectal cancer Neg  Hx    Stomach cancer Neg Hx    Colon polyps Neg Hx     Social History   Socioeconomic History   Marital status: Married    Spouse name: Not on file   Number of children: 6   Years of education: Not on file   Highest education level: Not on file  Occupational History   Not on file  Tobacco Use   Smoking status: Former   Smokeless tobacco: Never   Tobacco comments:    socially when younger  Vaping Use   Vaping status: Never Used  Substance and Sexual Activity   Alcohol use: Yes    Alcohol/week: 7.0 - 14.0 standard drinks of alcohol    Types: 7 - 14 Glasses of wine per week   Drug use: No   Sexual activity: Yes    Birth control/protection: None    Comment: vasectomy  Other Topics Concern   Not on file  Social History Narrative   Not on file   Social Determinants of Health   Financial Resource Strain: Not on file  Food Insecurity: No Food Insecurity (07/31/2021)   Received from [facility], [facility]   Hunger Vital Sign    Worried About Running Out of Food in the Last Year: Never true    Ran Out of Food in the  Last Year: Never true  Transportation Needs: Not on file  Physical Activity: Not on file  Stress: Not on file  Social Connections: Unknown (04/14/2022)   Received from [facility], [facility]   Social Network    Social Network: Not on file  Intimate Partner Violence: Unknown (03/06/2022)   Received from [facility], [facility]   HITS    Physically Hurt: Not on file    Insult or Talk Down To: Not on file    Threaten Physical Harm: Not on file    Scream or Curse: Not on file    Physical Exam: Vital signs in last 24 hours: @BP  (!) 164/104   Pulse 82   Temp 98.4 F (36.9 C) (Temporal)   Ht 5\' 6"  (1.676 m)   Wt 147 lb (66.7 kg)   LMP 11/04/2023 (Exact Date)   SpO2 100%   BMI 23.73 kg/m  GEN: NAD EYE: Sclerae anicteric ENT: MMM CV: Non-tachycardic Pulm: CTA b/l GI: Soft, NT/ND NEURO:  Alert & Oriented x 3   Doristine Locks,  DO [facility]   11/08/2023 9:33 AM

## 2023-11-08 NOTE — Progress Notes (Signed)
Pt resting comfortably. VSS. Airway intact. SBAR complete to RN. All questions answered.   

## 2023-11-09 ENCOUNTER — Telehealth: Payer: Self-pay

## 2023-11-09 NOTE — Telephone Encounter (Signed)
  Follow up Call-     11/08/2023    9:20 AM  Call back number  Post procedure Call Back phone  # [phone]  Permission to leave phone message Yes     Patient questions:  Do you have a fever, pain , or abdominal swelling? No. Pain Score  0 *  Have you tolerated food without any problems? Yes.    Have you been able to return to your normal activities? Yes.    Do you have any questions about your discharge instructions: Diet   No. Medications  No. Follow up visit  No.  Do you have questions or concerns about your Care? No.  Actions: * If pain score is 4 or above: No action needed, pain <4.

## 2023-11-10 LAB — SURGICAL PATHOLOGY

## 2024-01-11 DIAGNOSIS — L821 Other seborrheic keratosis: Secondary | ICD-10-CM | POA: Diagnosis not present

## 2024-01-11 DIAGNOSIS — D2262 Melanocytic nevi of left upper limb, including shoulder: Secondary | ICD-10-CM | POA: Diagnosis not present

## 2024-01-11 DIAGNOSIS — L578 Other skin changes due to chronic exposure to nonionizing radiation: Secondary | ICD-10-CM | POA: Diagnosis not present

## 2024-01-11 DIAGNOSIS — D225 Melanocytic nevi of trunk: Secondary | ICD-10-CM | POA: Diagnosis not present

## 2024-08-10 DIAGNOSIS — H5213 Myopia, bilateral: Secondary | ICD-10-CM | POA: Diagnosis not present

## 2024-08-17 DIAGNOSIS — D225 Melanocytic nevi of trunk: Secondary | ICD-10-CM | POA: Diagnosis not present

## 2024-08-17 DIAGNOSIS — L578 Other skin changes due to chronic exposure to nonionizing radiation: Secondary | ICD-10-CM | POA: Diagnosis not present

## 2024-08-17 DIAGNOSIS — D485 Neoplasm of uncertain behavior of skin: Secondary | ICD-10-CM | POA: Diagnosis not present

## 2024-08-17 DIAGNOSIS — L821 Other seborrheic keratosis: Secondary | ICD-10-CM | POA: Diagnosis not present

## 2024-08-17 DIAGNOSIS — L237 Allergic contact dermatitis due to plants, except food: Secondary | ICD-10-CM | POA: Diagnosis not present
